# Patient Record
Sex: Male | Born: 1955
Health system: Southern US, Community
[De-identification: ages and names within clinical notes are randomized; demographics above are authoritative.]

## PROBLEM LIST (undated history)

## (undated) DIAGNOSIS — F1011 Alcohol abuse, in remission: Secondary | ICD-10-CM

## (undated) DIAGNOSIS — K5790 Diverticulosis of intestine, part unspecified, without perforation or abscess without bleeding: Secondary | ICD-10-CM

## (undated) DIAGNOSIS — F1010 Alcohol abuse, uncomplicated: Secondary | ICD-10-CM

## (undated) DIAGNOSIS — I679 Cerebrovascular disease, unspecified: Secondary | ICD-10-CM

## (undated) DIAGNOSIS — M542 Cervicalgia: Secondary | ICD-10-CM

## (undated) DIAGNOSIS — E119 Type 2 diabetes mellitus without complications: Secondary | ICD-10-CM

## (undated) DIAGNOSIS — E785 Hyperlipidemia, unspecified: Secondary | ICD-10-CM

## (undated) DIAGNOSIS — I10 Essential (primary) hypertension: Secondary | ICD-10-CM

## (undated) HISTORY — DX: Cervicalgia: M54.2

## (undated) HISTORY — DX: Hyperlipidemia, unspecified: E78.5

## (undated) HISTORY — DX: Essential (primary) hypertension: I10

## (undated) HISTORY — DX: Diverticulosis of intestine, part unspecified, without perforation or abscess without bleeding: K57.90

## (undated) HISTORY — DX: Cerebrovascular disease, unspecified: I67.9

## (undated) HISTORY — DX: Alcohol abuse, in remission: F10.11

## (undated) HISTORY — DX: Type 2 diabetes mellitus without complications: E11.9

## (undated) HISTORY — PX: VASECTOMY: SHX75

## (undated) HISTORY — DX: Alcohol abuse, uncomplicated: F10.10

## (undated) HISTORY — PX: FRACTURE SURGERY: SHX138

---
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
## 2006-03-30 ENCOUNTER — Encounter: Admission: RE | Admit: 2006-03-30 | Discharge: 2006-03-30 | Payer: Self-pay | Admitting: Orthopedic Surgery

## 2006-07-01 HISTORY — PX: SHOULDER SURGERY: SHX246

## 2008-11-16 ENCOUNTER — Encounter: Admission: RE | Admit: 2008-11-16 | Discharge: 2008-11-16 | Payer: Self-pay | Admitting: Internal Medicine

## 2012-03-31 ENCOUNTER — Ambulatory Visit: Payer: BC Managed Care – PPO | Admitting: Internal Medicine

## 2012-03-31 ENCOUNTER — Ambulatory Visit: Payer: BC Managed Care – PPO

## 2012-03-31 VITALS — BP 152/86 | HR 55 | Temp 97.7°F | Resp 16 | Ht 70.0 in | Wt 189.0 lb

## 2012-03-31 DIAGNOSIS — R2230 Localized swelling, mass and lump, unspecified upper limb: Secondary | ICD-10-CM

## 2012-03-31 DIAGNOSIS — R5381 Other malaise: Secondary | ICD-10-CM

## 2012-03-31 DIAGNOSIS — K3189 Other diseases of stomach and duodenum: Secondary | ICD-10-CM

## 2012-03-31 DIAGNOSIS — R5383 Other fatigue: Secondary | ICD-10-CM

## 2012-03-31 DIAGNOSIS — R1013 Epigastric pain: Secondary | ICD-10-CM

## 2012-03-31 DIAGNOSIS — M25519 Pain in unspecified shoulder: Secondary | ICD-10-CM

## 2012-03-31 LAB — COMPREHENSIVE METABOLIC PANEL
ALT: 14 U/L (ref 0–53)
AST: 17 U/L (ref 0–37)
BUN: 15 mg/dL (ref 6–23)
CO2: 26 mEq/L (ref 19–32)
Calcium: 10 mg/dL (ref 8.4–10.5)
Chloride: 105 mEq/L (ref 96–112)
Creat: 1.03 mg/dL (ref 0.50–1.35)
Total Bilirubin: 0.7 mg/dL (ref 0.3–1.2)

## 2012-03-31 LAB — POCT CBC
Hemoglobin: 14.8 g/dL (ref 14.1–18.1)
Lymph, poc: 1.7 (ref 0.6–3.4)
MCH, POC: 28.8 pg (ref 27–31.2)
MCHC: 31.8 g/dL (ref 31.8–35.4)
MID (cbc): 0.6 (ref 0–0.9)
MPV: 8.8 fL (ref 0–99.8)
POC Granulocyte: 6.8 (ref 2–6.9)
POC LYMPH PERCENT: 18.9 %L (ref 10–50)
POC MID %: 6.2 %M (ref 0–12)
RDW, POC: 14 %
WBC: 9.1 10*3/uL (ref 4.6–10.2)

## 2012-03-31 LAB — POCT SEDIMENTATION RATE: POCT SED RATE: 18 mm/hr (ref 0–22)

## 2012-03-31 NOTE — Progress Notes (Addendum)
  Subjective:    Patient ID: Brendan Wagner, male    DOB: 1955/07/24, 56 y.o.   MRN: [id number]  HPIpain around shoulder Since injury in over 3 months ago Pain prevents elevation and pushing off  he felt a pop as he lifted a heavy piece of sheet rockAnd has not been full strength ever since on the left///history of similar pop with tear on the right that required surgery  Also complaining of fatigue, easy fatigability with normal activity over the past 6-12 months Achesin lower arms if he mows the lawn. Neck stiff all the time. 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  Past medical history-last exam 2010 with mild elevation of lipids Review of Systems History diverticulitis/blames this for epigastric symptoms that worsen with aspirin or being empty over the past 2 years/eearly morning symptoms/worse with spicy foods No weight loss night sweats No fevers    Objective:   Physical Exam Filed Vitals:   03/31/12 1325  BP: 152/86(Wife is home blood pressures weekly for many years and he is usually normal)   Pulse: 55  Temp: 97.7 F (36.5 C)  Resp: 16  HEENT clear without thyromegaly or lymphadenopathy Heart regular Lungs clear Left shoulder with a painful arc and with pain on any movement with resistance Nontender of the shoulder proper, but very tender over the scapula and just above the scapula on the left with a firm nodule above the scapula, 1-2 cm//also tender in the anterior portion of the shoulder in the pectoralis without mass or defects/full chest excursion without pain       [facility] reading (PRIMARY) by  Dr. Deserai Cansler=Shoulder appears normal 5 there is a mass affect just above the scapula seen on 2 views that appears  to be soft tissue  Results for orders placed in  visit on 03/31/12  POCT CBC      Component Value Range   WBC 9.1  4.6 - 10.2 K/uL   Lymph, poc 1.7  0.6 - 3.4   POC LYMPH PERCENT 18.9  10 - 50 %L   MID (cbc) 0.6  0 - 0.9   POC MID % 6.2  0 - 12 %M   POC Granulocyte 6.8  2 - 6.9   Granulocyte percent 74.9  37 - 80 %G   RBC 5.13  4.69 - 6.13 M/uL   Hemoglobin 14.8  14.1 - 18.1 g/dL   HCT, POC 40.9  81.1 - 53.7 %   MCV 90.8  80 - 97 fL   MCH, POC 28.8  27 - 31.2 pg   MCHC 31.8  31.8 - 35.4 g/dL   RDW, POC 91.4     Platelet Count, POC 268  142 - 424 K/uL   MPV 8.8  0 - 99.8 fL    Assessment & Plan:   1. Fatigue  POCT CBC, Comprehensive metabolic panel, TSH, Testosterone, Testosterone, free  2. Pain in shoulder  POCT SEDIMENTATION RATE, DG Shoulder Left  3. Dyspepsia  H. pylori antibody, IgG   Notify lab results  ? MRI soft tissue left shoulder///Would need open MRI due to claustrophobia Heat and range of motion for shoulder CPE soon  Addend: Radiologist agreed about mass soft tissue/will order MRI Labs were normal

## 2012-04-01 LAB — TESTOSTERONE, FREE, TOTAL, SHBG
Sex Hormone Binding: 43 nmol/L (ref 13–71)
Testosterone, Free: 67.1 pg/mL (ref 47.0–244.0)
Testosterone: 391.56 ng/dL (ref 300–890)

## 2012-04-01 LAB — TSH: TSH: 2.999 u[IU]/mL (ref 0.350–4.500)

## 2012-04-01 NOTE — Addendum Note (Signed)
Addended by: Tonye Pearson on: 04/01/2012 06:02 PM   Modules accepted: Orders

## 2012-04-03 NOTE — Progress Notes (Signed)
CPE appt made for 11/6.

## 2012-04-07 ENCOUNTER — Telehealth: Payer: Self-pay

## 2012-04-07 MED ORDER — DIAZEPAM 5 MG PO TABS: ORAL_TABLET | ORAL | Status: DC

## 2012-04-07 NOTE — Telephone Encounter (Signed)
Done and printed

## 2012-04-07 NOTE — Telephone Encounter (Signed)
Brendan Wagner at [facility] request most recent xray report to be faxed to [phone]. Patient in office now.

## 2012-04-07 NOTE — Telephone Encounter (Signed)
SPOKE TO PATIENT. HE WILL PICK SCRIPT. HE WAS WONDERING IF WE WOULD R/S THE MRI OR COULD HE? HE WILL CALL BACK IN THE MORNING ABOUT THIS.

## 2012-04-07 NOTE — Telephone Encounter (Signed)
Patient had mri scheduled today but got claustrophobic and had to reschedule. He would like to know if he could have something called in for that.  [phone]

## 2012-04-07 NOTE — Telephone Encounter (Signed)
Sent requested X-rays to Sarah at [facility].

## 2012-05-06 ENCOUNTER — Ambulatory Visit (INDEPENDENT_AMBULATORY_CARE_PROVIDER_SITE_OTHER): Payer: BC Managed Care – PPO | Admitting: Internal Medicine

## 2012-05-06 ENCOUNTER — Encounter: Payer: Self-pay | Admitting: Internal Medicine

## 2012-05-06 ENCOUNTER — Ambulatory Visit: Payer: BC Managed Care – PPO

## 2012-05-06 VITALS — BP 148/84 | HR 76 | Temp 98.0°F | Resp 16 | Ht 69.5 in | Wt 189.4 lb

## 2012-05-06 DIAGNOSIS — R079 Chest pain, unspecified: Secondary | ICD-10-CM

## 2012-05-06 DIAGNOSIS — M25519 Pain in unspecified shoulder: Secondary | ICD-10-CM

## 2012-05-06 DIAGNOSIS — Z0000 Encounter for general adult medical examination without abnormal findings: Secondary | ICD-10-CM

## 2012-05-06 LAB — POCT URINALYSIS DIPSTICK
Ketones, UA: NEGATIVE
Protein, UA: NEGATIVE
Spec Grav, UA: <=1.005
Urobilinogen, UA: 0.2
pH, UA: 5.5

## 2012-05-06 NOTE — Progress Notes (Signed)
  Subjective:    Patient ID: Brendan Wagner, male    DOB: 07-Sep-1955, 56 y.o.   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 so he is unsure what he will do next  At last visit in October he reported pain around L shoulder Since injury in over 3 months ago  Pain prevents elevation and pushing off  he felt a pop as he lifted a heavy piece of sheet rockAnd has not been full strength ever since on the left///history of similar pop with tear on the right that required surgery  Exams revealed a soft tissue mass above the clavicle that was tender The radiologist recommended MRI which was scheduled but could not be completed because of claustrophobia/he is to reschedule down to do this after taking Valium which has been successful in the past  Family history: Many relatives had some form of cancer Married with daughter away from home with good job Nonsmoker/occasional alcohol Immunizations up today Has never had a colonoscopy Review of Systems 13 point review of systems positive for resolving fatigue, neck stiffness, occasional tinnitus. Chest tightness occasionally with activity but not definitely associated with shortness of breath and no diaphoresis or palpitations Many of his joints ache including his elbows and wrists from the work that he does Mother died 4 months ago    Objective:   Physical Exam Filed Vitals:   05/06/12 0952  BP: 148/84  Pulse: 76  Temp: 98 F (36.7 C)  Resp: 16   Weight 189 pounds Pupils equal round and reactive to light and accommodation/EOMs conjugate Nares clear/TMs clear/oral pharynx clear/no nodes or thyromegaly Lungs clear to auscultation Chest wall nontender There is tenderness above the left clavicle in the  midline though not as much swelling as before The left shoulder still has pain with abduction against resistance Heart regular without murmurs or clicks/no carotid bruit Abdomen with no masses or organomegaly No abdominal bruit Rectal with no masses/prostates soft and symmetrical without nodules Spine straight Extremities with no edema/full peripheral pulses Has tenderness at the lateral epicondyle bilaterally Neurological intact  [facility] reading (PRIMARY) by  Dr. Merla Riches. Chest x-ray is clear EKG is within normal limits      Assessment & Plan:  Annual physical examination Problem #1? Mass left clavicle Problem #2 mild increase in blood pressure without the diagnosis of hypertension Problem #3 mild overweight Problem #4 multiple overuse joint symptoms Problem #5 mild chest tightness probably musculoskeletal  Recommend MRI as scheduled Lose weight Recheck blood pressure outside Colonoscopy Notify routine labs

## 2012-05-07 ENCOUNTER — Encounter: Payer: Self-pay | Admitting: Internal Medicine

## 2012-06-22 ENCOUNTER — Encounter: Payer: Self-pay | Admitting: Internal Medicine

## 2014-07-28 IMAGING — CR DG SHOULDER 2+V*L*
3 series · 3 of 3 positions shown · non-contrast
Comparison: None.

CLINICAL DATA: Shoulder pain.

LEFT SHOULDER - 2+ VIEW

[ap int rot]
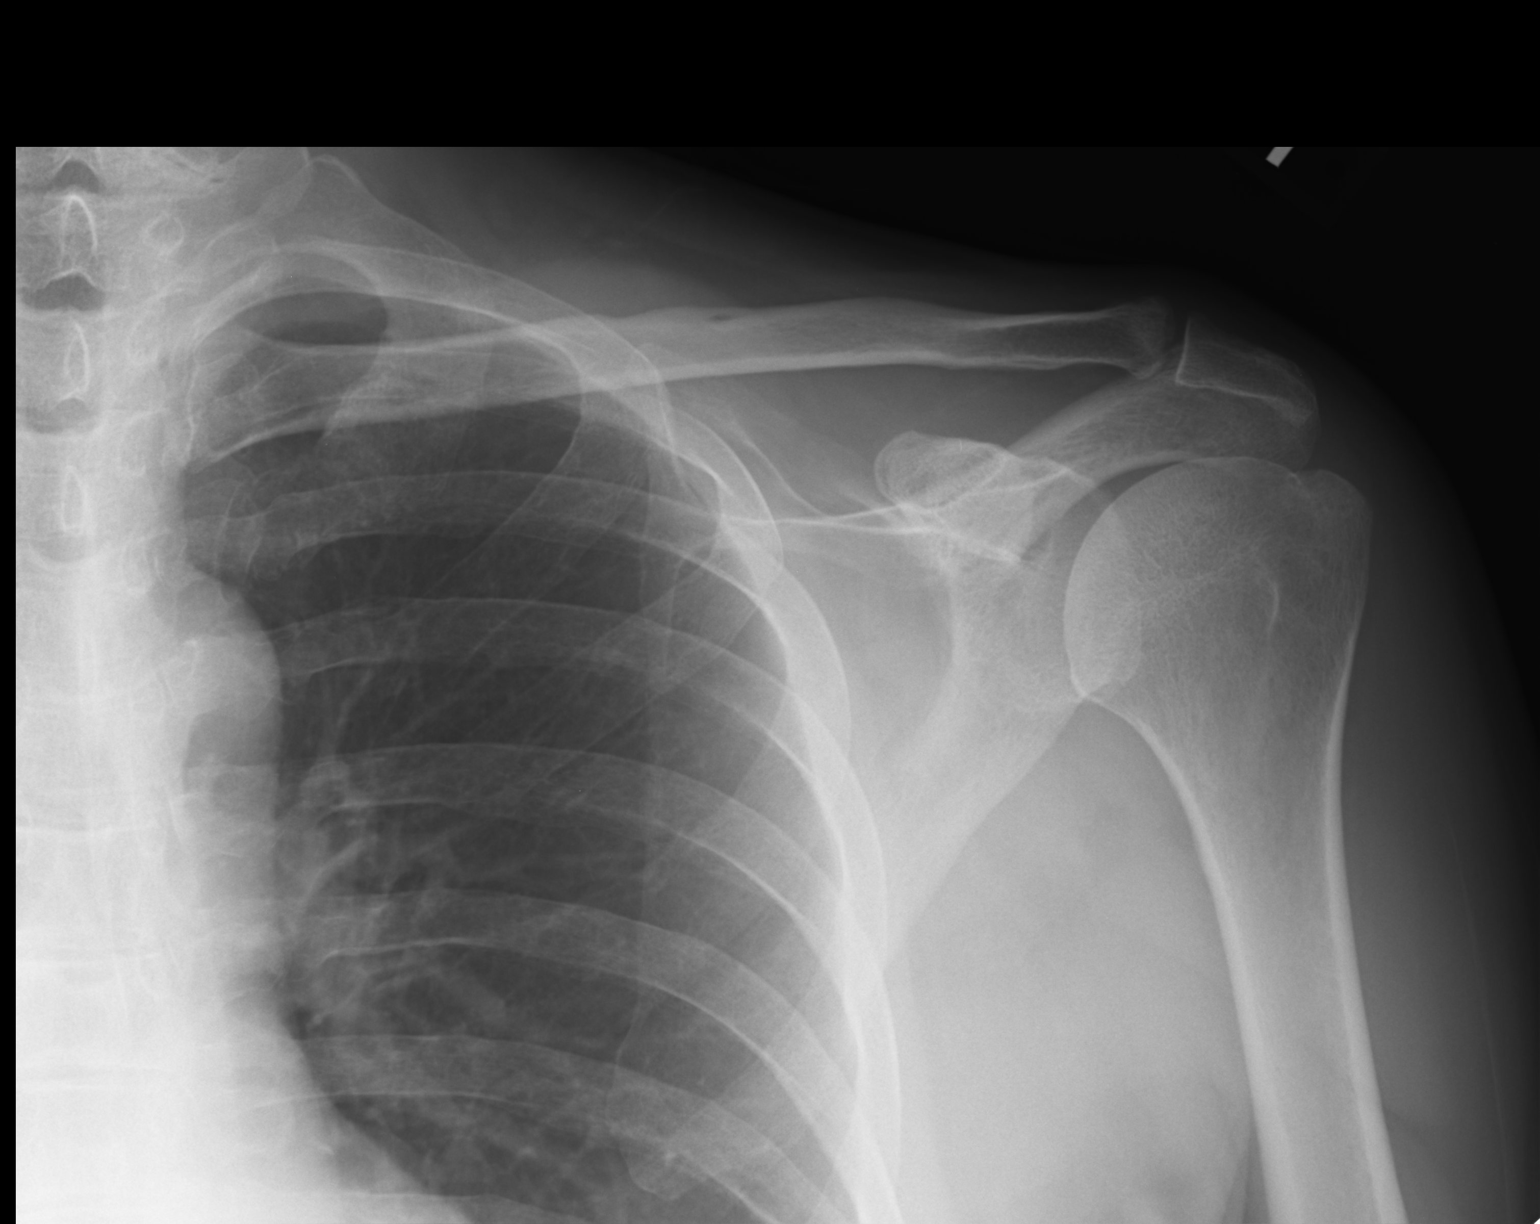

[ap ext rot]
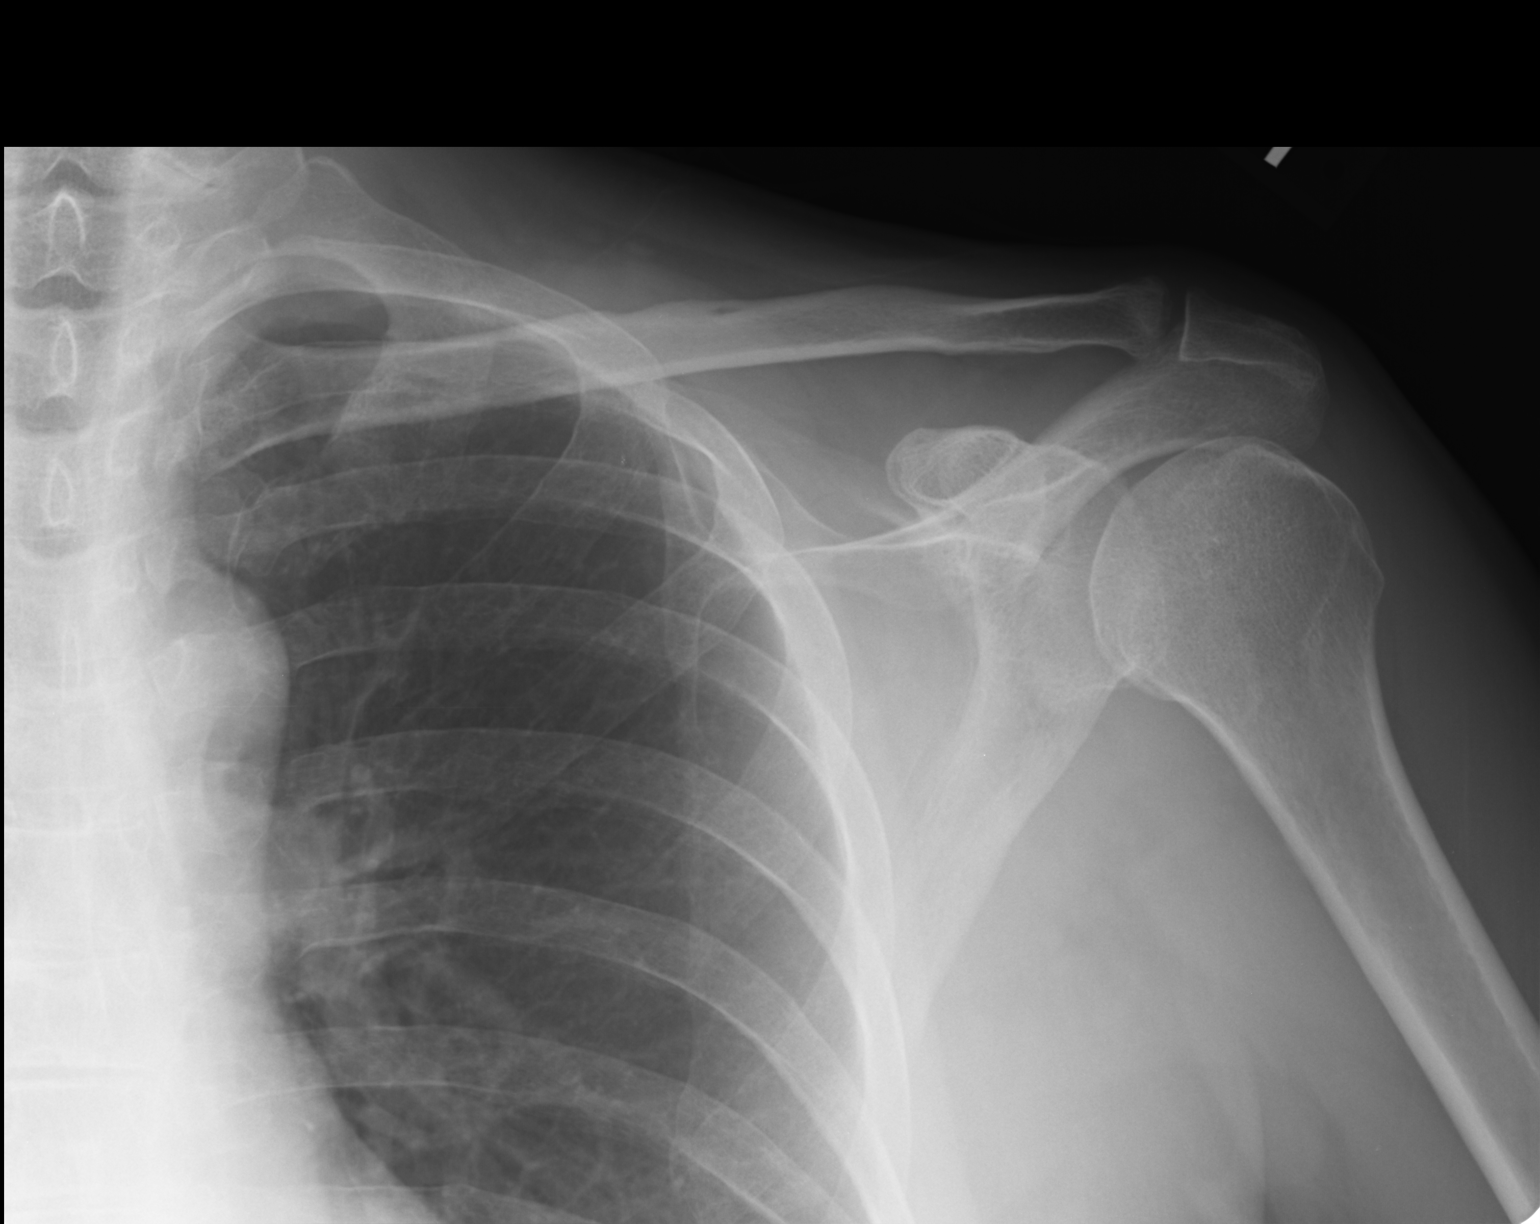

[AP]
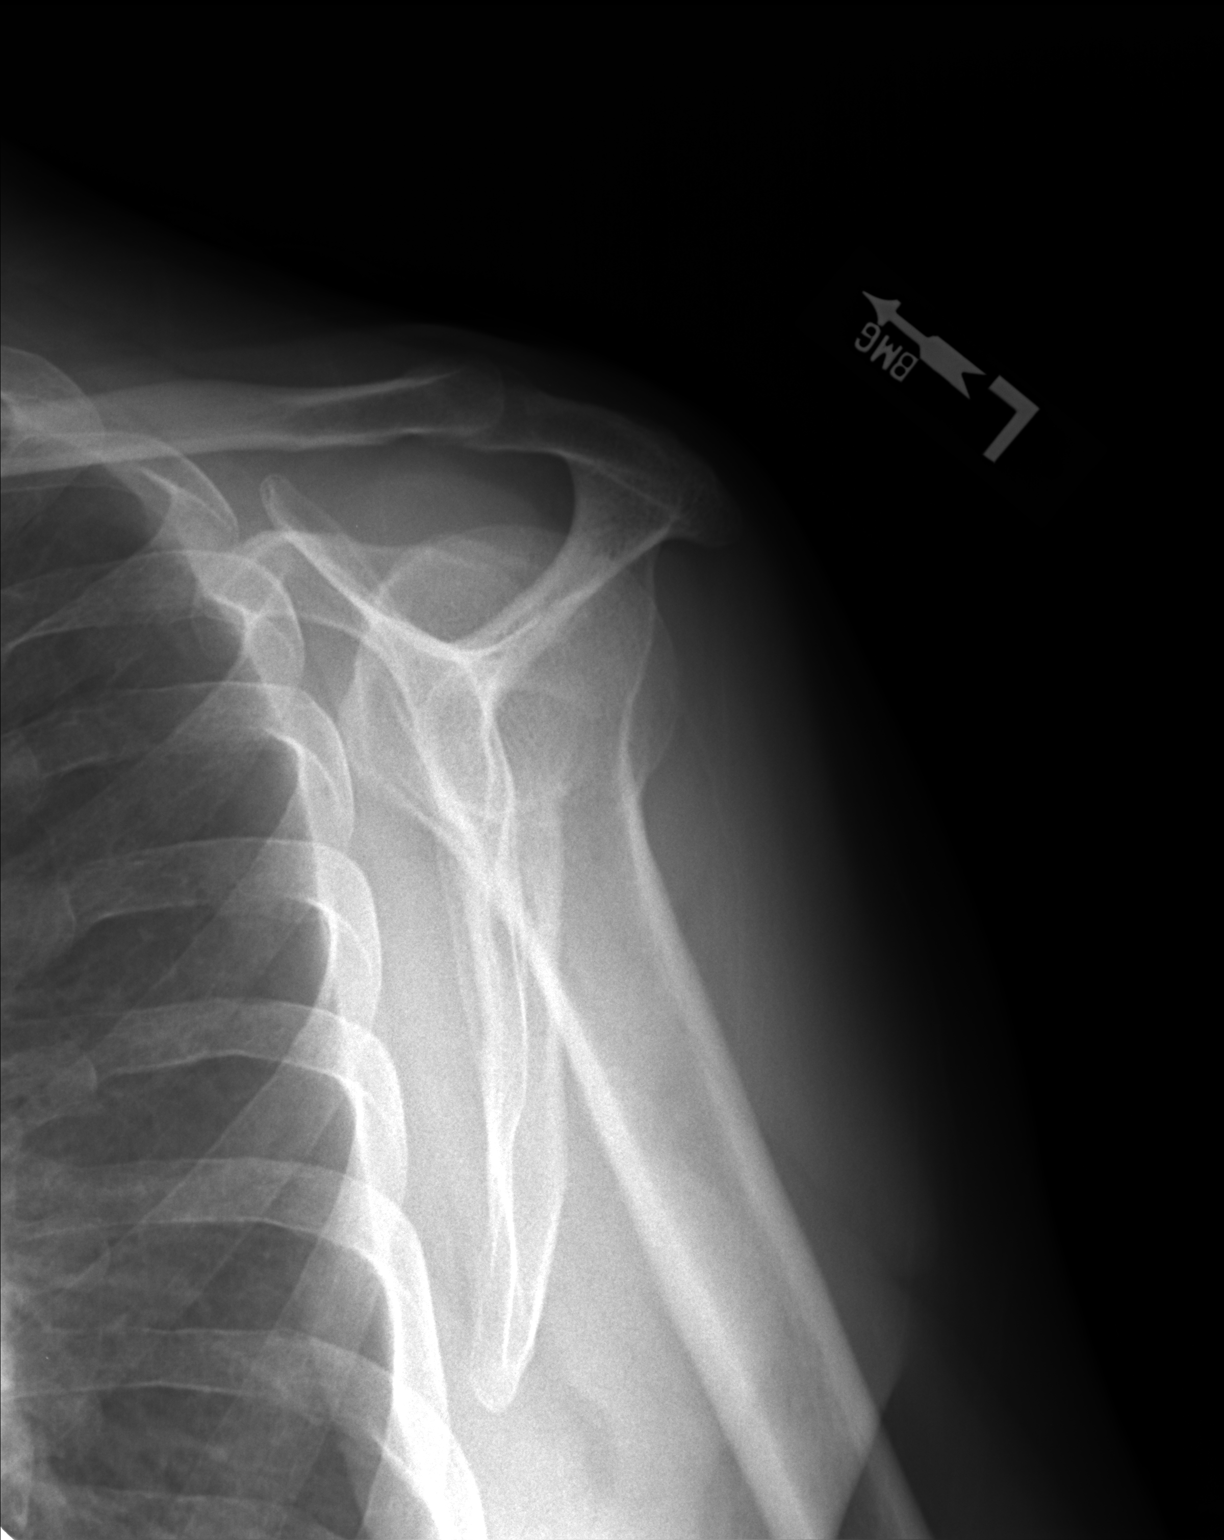

[3 of 3 positions shown; findings below may reference images not displayed]

FINDINGS: Minimal degenerative AC joint arthropathy. The acromial
undersurface is type 2 (curved).  No fracture or dislocation
observed.  Soft tissue density above the mid clavicle noted,
probably muscular, although the patient has a palpable mass in this
vicinity then MRI workup would be recommended.
IMPRESSION: 1.  Soft tissue density of the mid clavicle is probably muscular,
but if the patient has a palpable mass in this vicinity then MRI of
the left clavicle or MRI of the left shoulder with large field of
view to accommodate the mid clavicle would be recommended.
2.  Mild degenerative AC joint arthropathy.

## 2014-11-04 ENCOUNTER — Ambulatory Visit: Payer: 59

## 2015-02-15 ENCOUNTER — Ambulatory Visit (INDEPENDENT_AMBULATORY_CARE_PROVIDER_SITE_OTHER): Payer: 59 | Admitting: Internal Medicine

## 2015-02-15 ENCOUNTER — Encounter: Payer: Self-pay | Admitting: Internal Medicine

## 2015-02-15 VITALS — BP 140/78 | HR 64 | Temp 98.0°F | Resp 16 | Ht 69.0 in | Wt 189.8 lb

## 2015-02-15 DIAGNOSIS — Z23 Encounter for immunization: Secondary | ICD-10-CM | POA: Diagnosis not present

## 2015-02-15 DIAGNOSIS — Z0000 Encounter for general adult medical examination without abnormal findings: Secondary | ICD-10-CM | POA: Diagnosis not present

## 2015-02-15 DIAGNOSIS — R5383 Other fatigue: Secondary | ICD-10-CM

## 2015-02-15 LAB — CBC WITH DIFFERENTIAL/PLATELET
BASOS PCT: 1 % (ref 0–1)
Basophils Absolute: 0.1 10*3/uL (ref 0.0–0.1)
EOS ABS: 0.2 10*3/uL (ref 0.0–0.7)
EOS PCT: 3 % (ref 0–5)
HCT: 39.9 % (ref 39.0–52.0)
Hemoglobin: 14 g/dL (ref 13.0–17.0)
LYMPHS ABS: 1.2 10*3/uL (ref 0.7–4.0)
Lymphocytes Relative: 22 % (ref 12–46)
MCH: 29.5 pg (ref 26.0–34.0)
MCHC: 35.1 g/dL (ref 30.0–36.0)
MCV: 84.2 fL (ref 78.0–100.0)
MONO ABS: 0.5 10*3/uL (ref 0.1–1.0)
MONOS PCT: 9 % (ref 3–12)
MPV: 9.4 fL (ref 8.6–12.4)
NEUTROS PCT: 65 % (ref 43–77)
Neutro Abs: 3.4 10*3/uL (ref 1.7–7.7)
PLATELETS: 251 10*3/uL (ref 150–400)
RBC: 4.74 MIL/uL (ref 4.22–5.81)
RDW: 14.5 % (ref 11.5–15.5)
WBC: 5.3 10*3/uL (ref 4.0–10.5)

## 2015-02-15 LAB — COMPREHENSIVE METABOLIC PANEL
ALT: 20 U/L (ref 9–46)
AST: 19 U/L (ref 10–35)
Albumin: 4.3 g/dL (ref 3.6–5.1)
Alkaline Phosphatase: 55 U/L (ref 40–115)
BUN: 12 mg/dL (ref 7–25)
CHLORIDE: 104 mmol/L (ref 98–110)
CO2: 24 mmol/L (ref 20–31)
CREATININE: 0.81 mg/dL (ref 0.70–1.25)
Calcium: 9.5 mg/dL (ref 8.6–10.3)
Glucose, Bld: 96 mg/dL (ref 65–99)
POTASSIUM: 4.3 mmol/L (ref 3.5–5.3)
SODIUM: 139 mmol/L (ref 135–146)
Total Bilirubin: 0.5 mg/dL (ref 0.2–1.2)
Total Protein: 6.6 g/dL (ref 6.1–8.1)

## 2015-02-15 LAB — LIPID PANEL
CHOL/HDL RATIO: 4 ratio (ref <=5.0)
Cholesterol: 223 mg/dL — ABNORMAL HIGH (ref 125–200)
HDL: 56 mg/dL (ref >=40)
LDL Cholesterol: 144 mg/dL — ABNORMAL HIGH (ref <130)
Triglycerides: 115 mg/dL (ref <150)
VLDL: 23 mg/dL (ref <30)

## 2015-02-16 LAB — TSH: TSH: 3.562 u[IU]/mL (ref 0.350–4.500)

## 2015-02-16 LAB — HEPATITIS C ANTIBODY: HCV AB: NEGATIVE

## 2015-02-16 LAB — PSA: PSA: 0.48 ng/mL (ref <=4.00)

## 2015-02-16 LAB — HIV ANTIBODY (ROUTINE TESTING W REFLEX): HIV 1&2 Ab, 4th Generation: NONREACTIVE

## 2015-02-16 NOTE — Progress Notes (Signed)
   Subjective:    Patient ID: Brendan Wagner, male    DOB: 14-Dec-1955, 59 y.o.   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 Married w/ older kids doing well-daughter just moved to [employer] [place]      Review of Systems 14pt per form negative except jt compl R shpuolder s/p arthro w/ osteo and limited rom due to pain-gave up tennis alos lots of stiffness and discomf knees hips in early am or after sittin but activ not limited/no swell or red no radic sxt He also has c/o recent fatigue late in day after work--but no sleep dysruption(busy so only sleeps 6h) And no PND or snoring No wt changes or fevers 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 He appears well-developed and well-nourished.  HENT:  Head: Normocephalic and atraumatic.  Right Ear: Hearing, tympanic membrane, external ear and ear canal normal.  Left Ear: Hearing, tympanic membrane, external ear and ear canal normal.  Nose: Nose normal.  Mouth/Throat: Uvula is midline, oropharynx is clear and moist and mucous membranes are normal.  Eyes: Conjunctivae, EOM and lids are normal. Pupils are equal, round, and reactive to light. Right eye exhibits no discharge. Left eye exhibits no discharge. No scleral icterus.  Neck: Trachea normal and normal range of motion. Neck supple. Carotid bruit is not present.  Cardiovascular: Normal rate, regular rhythm, normal heart sounds, intact distal pulses and normal pulses.   No murmur heard. Pulmonary/Chest: Effort normal and breath sounds normal. No respiratory distress. He has no wheezes. He has no rhonchi. He has no rales.  Abdominal: Soft. Normal appearance and bowel sounds are normal. He exhibits no abdominal bruit. There is no tenderness.  Genitourinary: Rectum normal and prostate normal.  Musculoskeletal: Normal  range of motion. He exhibits no edema or tenderness.  R shoulder w/ gringd on overhead plus discomf but abd and add good in lower roms Other joints with good rom-hips better rom than most his age  Lymphadenopathy:       Head (right side): No submental, no submandibular, no tonsillar, no preauricular, no posterior auricular and no occipital adenopathy present.       Head (left side): No submental, no submandibular, no tonsillar, no preauricular, no posterior auricular and no occipital adenopathy present.    He has no cervical adenopathy.  Neurological: He is alert and oriented to person, place, and time. He has normal strength and normal reflexes. No cranial nerve deficit or sensory deficit. Coordination and gait normal.  Skin: Skin is warm, dry and intact. No lesion and no rash noted.  Psychiatric: He has a normal mood and affect. His speech is normal and behavior is normal. Judgment and thought content normal.   BP 140/78 mmHg  Pulse 64  Temp(Src) 98 F (36.7 C) (Oral)  Resp 16  Ht  (1.753 m)  Wt 189 lb 12.8 oz (86.093 kg)  BMI 28.02 kg/m2        Assessment & Plan:  Annual physical exam - Plan: CBC with Differential/Platelet, Comprehensive metabolic panel, PSA, Lipid panel, Hepatitis C antibody, HIV antibody  Other fatigue - Plan: TSH  Need for Tdap vaccination - Plan: Tdap vaccine greater than or equal to 7yo IM  Joint stiffness,shoulder OA--needs ongoing flex like yoga plus shoulder PT  Routine labs/contact

## 2015-02-19 ENCOUNTER — Encounter: Payer: Self-pay | Admitting: Internal Medicine

## 2015-05-29 ENCOUNTER — Encounter: Payer: Self-pay | Admitting: Internal Medicine

## 2016-08-08 ENCOUNTER — Encounter: Payer: Self-pay | Admitting: Urgent Care

## 2016-08-08 ENCOUNTER — Ambulatory Visit (INDEPENDENT_AMBULATORY_CARE_PROVIDER_SITE_OTHER): Payer: BLUE CROSS/BLUE SHIELD | Admitting: Urgent Care

## 2016-08-08 VITALS — BP 160/90 | HR 64 | Temp 97.9°F | Resp 16 | Ht 70.0 in | Wt 188.0 lb

## 2016-08-08 DIAGNOSIS — Z23 Encounter for immunization: Secondary | ICD-10-CM | POA: Diagnosis not present

## 2016-08-08 DIAGNOSIS — R030 Elevated blood-pressure reading, without diagnosis of hypertension: Secondary | ICD-10-CM

## 2016-08-08 DIAGNOSIS — Z0000 Encounter for general adult medical examination without abnormal findings: Secondary | ICD-10-CM | POA: Diagnosis not present

## 2016-08-08 DIAGNOSIS — I10 Essential (primary) hypertension: Secondary | ICD-10-CM

## 2016-08-08 DIAGNOSIS — M2550 Pain in unspecified joint: Secondary | ICD-10-CM

## 2016-08-08 DIAGNOSIS — M436 Torticollis: Secondary | ICD-10-CM

## 2016-08-08 MED ORDER — ZOSTER VACCINE LIVE 19400 UNT/0.65ML ~~LOC~~ SUSR: 0.6500 mL | Freq: Once | SUBCUTANEOUS | 0 refills | Status: AC

## 2016-08-08 MED ORDER — LISINOPRIL 10 MG PO TABS: 10.0000 mg | ORAL_TABLET | Freq: Every day | ORAL | 3 refills | Status: DC

## 2016-08-08 NOTE — Progress Notes (Signed)
MRN: [id number]  Subjective:   Mr. Brendan Wagner is a 61 y.o. male presenting for annual physical exam. 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 Denies smoking cigarettes. Has ~1 drink per day.   Medical care team includes: PCP: Marisel Tostenson, PA-C Vision: Wears eye glasses, sees eye doctor every other year. Plans on setting up a f/u appointment because of blurred vision. Dental: Gets cleanings every 6 months. Health Maintenance: Last colonoscopy was 2015, has f/u in 7-8 years.   Neck pain - Reports 1 year history of intermittent neck pain. Pain is achy, worsened by his work, relieved by taking bath in hot tub. 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 Has tried [employer] [place], heating pad. Also has joint aches in elbows, arms and knees. 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   Ammon has a current medication list which includes the following prescription(s): aspirin ec. He has No Known Allergies.  [place] denies past medical history. Also  has a past surgical history that includes Appendectomy (1973); Shoulder surgery (2008); Fracture surgery (Left); and Vasectomy. His family history includes Alcohol abuse in his brother; Alzheimer's disease in his father; Cancer in his father, maternal grandfather, and mother; Hyperlipidemia in his paternal grandfather; Hypertension in his father; Kidney disease in his mother.  Immunizations: Tdap 02/15/2015, Flu 06/27/2016  Review of Systems  Constitutional: Negative for chills, diaphoresis, fever, malaise/fatigue and weight loss.  HENT: Negative for congestion, ear discharge, ear pain, hearing loss, nosebleeds, sore throat and tinnitus.   Eyes: Negative for blurred vision, double vision, photophobia, pain, discharge and redness.  Respiratory: Negative for cough, shortness of breath and wheezing.   Cardiovascular: Negative for chest pain,  palpitations and leg swelling.  Gastrointestinal: Negative for abdominal pain, blood in stool, constipation, diarrhea, nausea and vomiting.  Genitourinary: Negative for dysuria, flank pain, frequency, hematuria and urgency.  Musculoskeletal: Positive for joint pain and neck pain. Negative for back pain and myalgias.  Skin: Negative for itching and rash.  Neurological: Negative for dizziness, tingling, seizures, loss of consciousness, weakness and headaches.  Endo/Heme/Allergies: Negative for polydipsia.  Psychiatric/Behavioral: Negative for depression, hallucinations, memory loss, substance abuse and suicidal ideas. The patient is not nervous/anxious and does not have insomnia.    Objective:   Vitals: BP (!) 160/90 (BP Location: Right Arm, Patient Position: Sitting, Cuff Size: Normal)   Pulse 64   Temp 97.9 F (36.6 C) (Oral)   Resp 16   Ht 5\' 10"  (1.778 m)   Wt 188 lb (85.3 kg)   SpO2 97%   BMI 26.98 kg/m   BP Readings from Last 3 Encounters:  08/08/16 (!) 160/90  02/15/15 140/78  05/06/12 (!) 148/84    Physical Exam  Constitutional: He is oriented to person, place, and time. He appears well-developed and well-nourished.  HENT:  TM's intact bilaterally, no effusions or erythema. Nasal turbinates pink and moist, nasal passages patent. No sinus tenderness. Oropharynx clear, mucous membranes moist, dentition in good repair.  Eyes: Conjunctivae and EOM are normal. Pupils are equal, round, and reactive to light. Right eye exhibits no discharge. Left eye exhibits no discharge. No scleral icterus.  Neck: Normal range of motion. Neck supple. No thyromegaly present.  Cardiovascular: Normal rate, regular rhythm and intact distal pulses.  Exam reveals no gallop and no friction rub.   No murmur heard. Pulmonary/Chest: No stridor.  No respiratory distress. He has no wheezes. He has no rales.  Abdominal: Soft. Bowel sounds are normal. He exhibits no distension and no mass. There is no  tenderness.  Musculoskeletal: Normal range of motion. He exhibits no edema or tenderness.  Lymphadenopathy:    He has no cervical adenopathy.  Neurological: He is alert and oriented to person, place, and time. He has normal reflexes.  Skin: Skin is warm and dry. No rash noted. No erythema. No pallor.  Psychiatric: He has a normal mood and affect.   Assessment and Plan :   1. Annual physical exam - Labs pending, patient is medically stable. Discussed healthy lifestyle, diet, exercise, preventative care, vaccinations, and addressed patient's concerns.    2. Need for shingles vaccine - Zoster Vaccine Live, PF, (ZOSTAVAX) 04540 UNT/0.65ML injection; Inject 19,400 Units into the skin once.  Dispense: 1 each; Refill: 0  3. Elevated blood pressure reading 4. Essential hypertension - Start lisinopril. Continue healthy diet. Recheck in 4 weeks.  5. Neck stiffness 6. Arthralgia, unspecified joint - Offered x-rays but patient declined. I also offered muscle relaxant but patient would like to hold off. He will use APAP as needed. Recheck prn.  Wallis Bamberg, PA-C [facility] at [facility] [phone] 08/08/2016  9:07 AM

## 2016-08-08 NOTE — Patient Instructions (Addendum)
Keeping you healthy  Get these tests  Blood pressure- Have your blood pressure checked once a year by your healthcare provider.  Normal blood pressure is 120/80  Weight- Have your body mass index (BMI) calculated to screen for obesity.  BMI is a measure of body fat based on height and weight. You can also calculate your own BMI at [email or web address].  Cholesterol- Have your cholesterol checked every year.  Diabetes- Have your blood sugar checked regularly if you have high blood pressure, high cholesterol, have a family history of diabetes or if you are overweight.  Screening for Colon Cancer- Colonoscopy starting at age 53.  Screening may begin sooner depending on your family history and other health conditions. Follow up colonoscopy as directed by your Gastroenterologist.  Screening for Prostate Cancer- Both blood work (PSA) and a rectal exam help screen for Prostate Cancer.  Screening begins at age 1 with African-American men and at age 25 with Caucasian men.  Screening may begin sooner depending on your family history.  Take these medicines  Aspirin- One aspirin daily can help prevent Heart disease and Stroke.  Flu shot- Every fall.  Tetanus- Every 10 years.  Zostavax- Once after the age of 80 to prevent Shingles.  Pneumonia shot- Once after the age of 46; if you are younger than 70, ask your healthcare provider if you need a Pneumonia shot.  Take these steps  Don't smoke- If you do smoke, talk to your doctor about quitting.  For tips on how to quit, go to www.smokefree.gov or call 1-800-QUIT-NOW.  Be physically active- Exercise 5 days a week for at least 30 minutes.  If you are not already physically active start slow and gradually work up to 30 minutes of moderate physical activity.  Examples of moderate activity include walking briskly, mowing the yard, dancing, swimming, bicycling, etc.  Eat a healthy diet- Eat a variety of healthy food such as fruits, vegetables, low  fat milk, low fat cheese, yogurt, lean meant, poultry, fish, beans, tofu, etc. For more information go to www.thenutritionsource.org  Drink alcohol in moderation- Limit alcohol intake to less than two drinks a day. Never drink and drive.  Dentist- Brush and floss twice daily; visit your dentist twice a year.  Depression- Your emotional health is as important as your physical health. If you're feeling down, or losing interest in things you would normally enjoy please talk to your healthcare provider.  Eye exam- Visit your eye doctor every year.  Safe sex- If you may be exposed to a sexually transmitted infection, use a condom.  Seat belts- Seat belts can save your life; always wear one.  Smoke/Carbon Monoxide detectors- These detectors need to be installed on the appropriate level of your home.  Replace batteries at least once a year.  Skin cancer- When out in the sun, cover up and use sunscreen 15 SPF or higher.  Violence- If anyone is threatening you, please tell your healthcare provider.  Living Will/ Health care power of attorney- Speak with your healthcare provider and family.    Hypertension Hypertension, commonly called high blood pressure, is when the force of blood pumping through your arteries is too strong. Your arteries are the blood vessels that carry blood from your heart throughout your body. A blood pressure reading consists of a higher number over a lower number, such as 110/72. The higher number (systolic) is the pressure inside your arteries when your heart pumps. The lower number (diastolic) is the pressure inside your arteries when  your heart relaxes. Ideally you want your blood pressure below 120/80. Hypertension forces your heart to work harder to pump blood. Your arteries may become narrow or stiff. Having untreated or uncontrolled hypertension can cause heart attack, stroke, kidney disease, and other problems. What increases the risk? Some risk factors for high  blood pressure are controllable. Others are not. Risk factors you cannot control include:  Race. You may be at higher risk if you are African American.  Age. Risk increases with age.  Gender. Men are at higher risk than women before age 63 years. After age 63, women are at higher risk than men. Risk factors you can control include:  Not getting enough exercise or physical activity.  Being overweight.  Getting too much fat, sugar, calories, or salt in your diet.  Drinking too much alcohol. What are the signs or symptoms? Hypertension does not usually cause signs or symptoms. Extremely high blood pressure (hypertensive crisis) may cause headache, anxiety, shortness of breath, and nosebleed. How is this diagnosed? To check if you have hypertension, your health care provider will measure your blood pressure while you are seated, with your arm held at the level of your heart. It should be measured at least twice using the same arm. Certain conditions can cause a difference in blood pressure between your right and left arms. A blood pressure reading that is higher than normal on one occasion does not mean that you need treatment. If it is not clear whether you have high blood pressure, you may be asked to return on a different day to have your blood pressure checked again. Or, you may be asked to monitor your blood pressure at home for 1 or more weeks. How is this treated? Treating high blood pressure includes making lifestyle changes and possibly taking medicine. Living a healthy lifestyle can help lower high blood pressure. You may need to change some of your habits. Lifestyle changes may include:  Following the DASH diet. This diet is high in fruits, vegetables, and whole grains. It is low in salt, red meat, and added sugars.  Keep your sodium intake below 2,300 mg per day.  Getting at least 30-45 minutes of aerobic exercise at least 4 times per week.  Losing weight if necessary.  Not  smoking.  Limiting alcoholic beverages.  Learning ways to reduce stress. Your health care provider may prescribe medicine if lifestyle changes are not enough to get your blood pressure under control, and if one of the following is true:  You are 1918-63 years of age and your systolic blood pressure is above 140.  You are 63 years of age or older, and your systolic blood pressure is above 150.  Your diastolic blood pressure is above 90.  You have diabetes, and your systolic blood pressure is over 140 or your diastolic blood pressure is over 90.  You have kidney disease and your blood pressure is above 140/90.  You have heart disease and your blood pressure is above 140/90. Your personal target blood pressure may vary depending on your medical conditions, your age, and other factors. Follow these instructions at home:  Have your blood pressure rechecked as directed by your health care provider.  Take medicines only as directed by your health care provider. Follow the directions carefully. Blood pressure medicines must be taken as prescribed. The medicine does not work as well when you skip doses. Skipping doses also puts you at risk for problems.  Do not smoke.  Monitor your blood  pressure at home as directed by your health care provider. Contact a health care provider if:  You think you are having a reaction to medicines taken.  You have recurrent headaches or feel dizzy.  You have swelling in your ankles.  You have trouble with your vision. Get help right away if:  You develop a severe headache or confusion.  You have unusual weakness, numbness, or feel faint.  You have severe chest or abdominal pain.  You vomit repeatedly.  You have trouble breathing. This information is not intended to replace advice given to you by your health care provider. Make sure you discuss any questions you have with your health care provider. Document Released: 06/17/2005 Document Revised:  11/23/2015 Document Reviewed: 04/09/2013 Elsevier Interactive Patient Education  2017 Elsevier Inc.    Lisinopril tablets What is this medicine? LISINOPRIL (lyse IN oh pril) is an ACE inhibitor. This medicine is used to treat high blood pressure and heart failure. It is also used to protect the heart immediately after a heart attack. This medicine may be used for other purposes; ask your health care provider or pharmacist if you have questions. COMMON BRAND NAME(S): Prinivil, Zestril What should I tell my health care provider before I take this medicine? They need to know if you have any of these conditions: -diabetes -heart or blood vessel disease -kidney disease -low blood pressure -previous swelling of the tongue, face, or lips with difficulty breathing, difficulty swallowing, hoarseness, or tightening of the throat -an unusual or allergic reaction to lisinopril, other ACE inhibitors, insect venom, foods, dyes, or preservatives -pregnant or trying to get pregnant -breast-feeding How should I use this medicine? Take this medicine by mouth with a glass of water. Follow the directions on your prescription label. You may take this medicine with or without food. If it upsets your stomach, take it with food. Take your medicine at regular intervals. Do not take it more often than directed. Do not stop taking except on your doctor's advice. Talk to your pediatrician regarding the use of this medicine in children. Special care may be needed. While this drug may be prescribed for children as young as 80 years of age for selected conditions, precautions do apply. Overdosage: If you think you have taken too much of this medicine contact a poison control center or emergency room at once. NOTE: This medicine is only for you. Do not share this medicine with others. What if I miss a dose? If you miss a dose, take it as soon as you can. If it is almost time for your next dose, take only that dose. Do not  take double or extra doses. What may interact with this medicine? Do not take this medicine with any of the following medications: -hymenoptera venom -sacubitril; valsartan This medicines may also interact with the following medications: -aliskiren -angiotensin receptor blockers, like losartan or valsartan -certain medicines for diabetes -diuretics -everolimus -gold compounds -lithium -NSAIDs, medicines for pain and inflammation, like ibuprofen or naproxen -potassium salts or supplements -salt substitutes -sirolimus -temsirolimus This list may not describe all possible interactions. Give your health care provider a list of all the medicines, herbs, non-prescription drugs, or dietary supplements you use. Also tell them if you smoke, drink alcohol, or use illegal drugs. Some items may interact with your medicine. What should I watch for while using this medicine? Visit your doctor or health care professional for regular check ups. Check your blood pressure as directed. Ask your doctor what your blood pressure  should be, and when you should contact him or her. Do not treat yourself for coughs, colds, or pain while you are using this medicine without asking your doctor or health care professional for advice. Some ingredients may increase your blood pressure. Women should inform their doctor if they wish to become pregnant or think they might be pregnant. There is a potential for serious side effects to an unborn child. Talk to your health care professional or pharmacist for more information. Check with your doctor or health care professional if you get an attack of severe diarrhea, nausea and vomiting, or if you sweat a lot. The loss of too much body fluid can make it dangerous for you to take this medicine. You may get drowsy or dizzy. Do not drive, use machinery, or do anything that needs mental alertness until you know how this drug affects you. Do not stand or sit up quickly, especially if  you are an older patient. This reduces the risk of dizzy or fainting spells. Alcohol can make you more drowsy and dizzy. Avoid alcoholic drinks. Avoid salt substitutes unless you are told otherwise by your doctor or health care professional. What side effects may I notice from receiving this medicine? Side effects that you should report to your doctor or health care professional as soon as possible: -allergic reactions like skin rash, itching or hives, swelling of the hands, feet, face, lips, throat, or tongue -breathing problems -signs and symptoms of kidney injury like trouble passing urine or change in the amount of urine -signs and symptoms of increased potassium like muscle weakness; chest pain; or fast, irregular heartbeat -signs and symptoms of liver injury like dark yellow or brown urine; general ill feeling or flu-like symptoms; light-colored stools; loss of appetite; nausea; right upper belly pain; unusually weak or tired; yellowing of the eyes or skin -signs and symptoms of low blood pressure like dizziness; feeling faint or lightheaded, falls; unusually weak or tired -stomach pain with or without nausea and vomiting Side effects that usually do not require medical attention (report to your doctor or health care professional if they continue or are bothersome): -changes in taste -cough -dizziness -fever -headache -sensitivity to light This list may not describe all possible side effects. Call your doctor for medical advice about side effects. You may report side effects to FDA at 1-800-FDA-1088. Where should I keep my medicine? Keep out of the reach of children. Store at room temperature between 15 and 30 degrees C (59 and 86 degrees F). Protect from moisture. Keep container tightly closed. Throw away any unused medicine after the expiration date. NOTE: This sheet is a summary. It may not cover all possible information. If you have questions about this medicine, talk to your doctor,  pharmacist, or health care provider.  2017 Elsevier/Gold Standard (2015-08-07 12:52:35)     You may take 500mg  Tylenol every 6 hours for pain and inflammation. This is also recommended for arthritis.  Cyclobenzaprine tablets What is this medicine? CYCLOBENZAPRINE (sye kloe BEN za preen) is a muscle relaxer. It is used to treat muscle pain, spasms, and stiffness. This medicine may be used for other purposes; ask your health care provider or pharmacist if you have questions. COMMON BRAND NAME(S): Fexmid, Flexeril What should I tell my health care provider before I take this medicine? They need to know if you have any of these conditions: -heart disease, irregular heartbeat, or previous heart attack -liver disease -thyroid problem -an unusual or allergic reaction to cyclobenzaprine, tricyclic  antidepressants, lactose, other medicines, foods, dyes, or preservatives -pregnant or trying to get pregnant -breast-feeding How should I use this medicine? Take this medicine by mouth with a glass of water. Follow the directions on the prescription label. If this medicine upsets your stomach, take it with food or milk. Take your medicine at regular intervals. Do not take it more often than directed. Talk to your pediatrician regarding the use of this medicine in children. Special care may be needed. Overdosage: If you think you have taken too much of this medicine contact a poison control center or emergency room at once. NOTE: This medicine is only for you. Do not share this medicine with others. What if I miss a dose? If you miss a dose, take it as soon as you can. If it is almost time for your next dose, take only that dose. Do not take double or extra doses. What may interact with this medicine? Do not take this medicine with any of the following medications: -certain medicines for fungal infections like fluconazole, itraconazole, ketoconazole, posaconazole,  voriconazole -cisapride -dofetilide -dronedarone -halofantrine -levomethadyl -MAOIs like Carbex, Eldepryl, Marplan, Nardil, and Parnate -narcotic medicines for cough -pimozide -thioridazine -ziprasidone This medicine may also interact with the following medications: -alcohol -antihistamines for allergy, cough and cold -certain medicines for anxiety or sleep -certain medicines for cancer -certain medicines for depression like amitriptyline, fluoxetine, sertraline -certain medicines for infection like alfuzosin, chloroquine, clarithromycin, levofloxacin, mefloquine, pentamidine, troleandomycin -certain medicines for irregular heart beat -certain medicines for seizures like phenobarbital, primidone -contrast dyes -general anesthetics like halothane, isoflurane, methoxyflurane, propofol -local anesthetics like lidocaine, pramoxine, tetracaine -medicines that relax muscles for surgery -narcotic medicines for pain -other medicines that prolong the QT interval (cause an abnormal heart rhythm) -phenothiazines like chlorpromazine, mesoridazine, prochlorperazine This list may not describe all possible interactions. Give your health care provider a list of all the medicines, herbs, non-prescription drugs, or dietary supplements you use. Also tell them if you smoke, drink alcohol, or use illegal drugs. Some items may interact with your medicine. What should I watch for while using this medicine? Tell your doctor or health care professional if your symptoms do not start to get better or if they get worse. You may get drowsy or dizzy. Do not drive, use machinery, or do anything that needs mental alertness until you know how this medicine affects you. Do not stand or sit up quickly, especially if you are an older patient. This reduces the risk of dizzy or fainting spells. Alcohol may interfere with the effect of this medicine. Avoid alcoholic drinks. If you are taking another medicine that also  causes drowsiness, you may have more side effects. Give your health care provider a list of all medicines you use. Your doctor will tell you how much medicine to take. Do not take more medicine than directed. Call emergency for help if you have problems breathing or unusual sleepiness. Your mouth may get dry. Chewing sugarless gum or sucking hard candy, and drinking plenty of water may help. Contact your doctor if the problem does not go away or is severe. What side effects may I notice from receiving this medicine? Side effects that you should report to your doctor or health care professional as soon as possible: -allergic reactions like skin rash, itching or hives, swelling of the face, lips, or tongue -breathing problems -chest pain -fast, irregular heartbeat -hallucinations -seizures -unusually weak or tired Side effects that usually do not require medical attention (report to your doctor  or health care professional if they continue or are bothersome): -headache -nausea, vomiting This list may not describe all possible side effects. Call your doctor for medical advice about side effects. You may report side effects to FDA at 1-800-FDA-1088. Where should I keep my medicine? Keep out of the reach of children. Store at room temperature between 15 and 30 degrees C (59 and 86 degrees F). Keep container tightly closed. Throw away any unused medicine after the expiration date. NOTE: This sheet is a summary. It may not cover all possible information. If you have questions about this medicine, talk to your doctor, pharmacist, or health care provider.  2017 Elsevier/Gold Standard (2015-03-28 12:05:46)    IF you received an x-ray today, you will receive an invoice from [facility]. Please contact [facility] at [phone] with questions or concerns regarding your invoice.   IF you received labwork today, you will receive an invoice from [facility]. Please contact [facility] at  [phone] with questions or concerns regarding your invoice.   Our billing staff will not be able to assist you with questions regarding bills from these companies.  You will be contacted with the lab results as soon as they are available. The fastest way to get your results is to activate your My Chart account. Instructions are located on the last page of this paperwork. If you have not heard from [place] regarding the results in 2 weeks, please contact this office.

## 2016-08-09 LAB — CMP14+EGFR
ALT: 19 IU/L (ref 0–44)
AST: 21 IU/L (ref 0–40)
Albumin/Globulin Ratio: 2.1 (ref 1.2–2.2)
Albumin: 4.7 g/dL (ref 3.6–4.8)
Alkaline Phosphatase: 68 IU/L (ref 39–117)
BUN/Creatinine Ratio: 17 (ref 10–24)
BUN: 15 mg/dL (ref 8–27)
Bilirubin Total: 0.5 mg/dL (ref 0.0–1.2)
CALCIUM: 9.6 mg/dL (ref 8.6–10.2)
CO2: 22 mmol/L (ref 18–29)
CREATININE: 0.88 mg/dL (ref 0.76–1.27)
Chloride: 102 mmol/L (ref 96–106)
GFR calc Af Amer: 106 mL/min/{1.73_m2} (ref >59)
GFR, EST NON AFRICAN AMERICAN: 92 mL/min/{1.73_m2} (ref >59)
GLOBULIN, TOTAL: 2.2 g/dL (ref 1.5–4.5)
Glucose: 91 mg/dL (ref 65–99)
Potassium: 4.4 mmol/L (ref 3.5–5.2)
Sodium: 142 mmol/L (ref 134–144)
TOTAL PROTEIN: 6.9 g/dL (ref 6.0–8.5)

## 2016-08-09 LAB — PSA: Prostate Specific Ag, Serum: 0.4 ng/mL (ref 0.0–4.0)

## 2016-08-09 LAB — LIPID PANEL
CHOL/HDL RATIO: 4.3 ratio (ref 0.0–5.0)
CHOLESTEROL TOTAL: 243 mg/dL — AB (ref 100–199)
HDL: 57 mg/dL (ref >39)
LDL CALC: 161 mg/dL — AB (ref 0–99)
Triglycerides: 125 mg/dL (ref 0–149)
VLDL Cholesterol Cal: 25 mg/dL (ref 5–40)

## 2016-08-09 LAB — TSH: TSH: 3.75 u[IU]/mL (ref 0.450–4.500)

## 2016-09-05 ENCOUNTER — Encounter: Payer: Self-pay | Admitting: Urgent Care

## 2016-09-05 ENCOUNTER — Ambulatory Visit (INDEPENDENT_AMBULATORY_CARE_PROVIDER_SITE_OTHER): Payer: BLUE CROSS/BLUE SHIELD | Admitting: Urgent Care

## 2016-09-05 VITALS — BP 142/78 | HR 59 | Temp 97.8°F | Resp 16 | Ht 70.0 in | Wt 188.0 lb

## 2016-09-05 DIAGNOSIS — M542 Cervicalgia: Secondary | ICD-10-CM | POA: Diagnosis not present

## 2016-09-05 DIAGNOSIS — I10 Essential (primary) hypertension: Secondary | ICD-10-CM

## 2016-09-05 DIAGNOSIS — M436 Torticollis: Secondary | ICD-10-CM | POA: Diagnosis not present

## 2016-09-05 DIAGNOSIS — Z8249 Family history of ischemic heart disease and other diseases of the circulatory system: Secondary | ICD-10-CM | POA: Diagnosis not present

## 2016-09-05 MED ORDER — AMLODIPINE BESYLATE 5 MG PO TABS: 5.0000 mg | ORAL_TABLET | Freq: Every day | ORAL | 3 refills | Status: DC

## 2016-09-05 NOTE — Patient Instructions (Addendum)
Hypertension Hypertension, commonly called high blood pressure, is when the force of blood pumping through the arteries is too strong. The arteries are the blood vessels that carry blood from the heart throughout the body. Hypertension forces the heart to work harder to pump blood and may cause arteries to become narrow or stiff. Having untreated or uncontrolled hypertension can cause heart attacks, strokes, kidney disease, and other problems. A blood pressure reading consists of a higher number over a lower number. Ideally, your blood pressure should be below 120/80. The first ("top") number is called the systolic pressure. It is a measure of the pressure in your arteries as your heart beats. The second ("bottom") number is called the diastolic pressure. It is a measure of the pressure in your arteries as the heart relaxes. What are the causes? The cause of this condition is not known. What increases the risk? Some risk factors for high blood pressure are under your control. Others are not. Factors you can change   Smoking.  Having type 2 diabetes mellitus, high cholesterol, or both.  Not getting enough exercise or physical activity.  Being overweight.  Having too much fat, sugar, calories, or salt (sodium) in your diet.  Drinking too much alcohol. Factors that are difficult or impossible to change   Having chronic kidney disease.  Having a family history of high blood pressure.  Age. Risk increases with age.  Race. You may be at higher risk if you are African-American.  Gender. Men are at higher risk than women before age 45. After age 65, women are at higher risk than men.  Having obstructive sleep apnea.  Stress. What are the signs or symptoms? Extremely high blood pressure (hypertensive crisis) may cause:  Headache.  Anxiety.  Shortness of breath.  Nosebleed.  Nausea and vomiting.  Severe chest pain.  Jerky movements you cannot control (seizures). How is this  diagnosed? This condition is diagnosed by measuring your blood pressure while you are seated, with your arm resting on a surface. The cuff of the blood pressure monitor will be placed directly against the skin of your upper arm at the level of your heart. It should be measured at least twice using the same arm. Certain conditions can cause a difference in blood pressure between your right and left arms. Certain factors can cause blood pressure readings to be lower or higher than normal (elevated) for a short period of time:  When your blood pressure is higher when you are in a health care provider's office than when you are at home, this is called white coat hypertension. Most people with this condition do not need medicines.  When your blood pressure is higher at home than when you are in a health care provider's office, this is called masked hypertension. Most people with this condition may need medicines to control blood pressure. If you have a high blood pressure reading during one visit or you have normal blood pressure with other risk factors:  You may be asked to return on a different day to have your blood pressure checked again.  You may be asked to monitor your blood pressure at home for 1 week or longer. If you are diagnosed with hypertension, you may have other blood or imaging tests to help your health care provider understand your overall risk for other conditions. How is this treated? This condition is treated by making healthy lifestyle changes, such as eating healthy foods, exercising more, and reducing your alcohol intake. Your health   care provider may prescribe medicine if lifestyle changes are not enough to get your blood pressure under control, and if:  Your systolic blood pressure is above 130.  Your diastolic blood pressure is above 80. Your personal target blood pressure may vary depending on your medical conditions, your age, and other factors. Follow these instructions  at home: Eating and drinking   Eat a diet that is high in fiber and potassium, and low in sodium, added sugar, and fat. An example eating plan is called the DASH (Dietary Approaches to Stop Hypertension) diet. To eat this way:  Eat plenty of fresh fruits and vegetables. Try to fill half of your plate at each meal with fruits and vegetables.  Eat whole grains, such as whole wheat pasta, brown rice, or whole grain bread. Fill about one quarter of your plate with whole grains.  Eat or drink low-fat dairy products, such as skim milk or low-fat yogurt.  Avoid fatty cuts of meat, processed or cured meats, and poultry with skin. Fill about one quarter of your plate with lean proteins, such as fish, chicken without skin, beans, eggs, and tofu.  Avoid premade and processed foods. These tend to be higher in sodium, added sugar, and fat.  Reduce your daily sodium intake. Most people with hypertension should eat less than 1,500 mg of sodium a day.  Limit alcohol intake to no more than 1 drink a day for nonpregnant women and 2 drinks a day for men. One drink equals 12 oz of beer, 5 oz of wine, or 1 oz of hard liquor. Lifestyle   Work with your health care provider to maintain a healthy body weight or to lose weight. Ask what an ideal weight is for you.  Get at least 30 minutes of exercise that causes your heart to beat faster (aerobic exercise) most days of the week. Activities may include walking, swimming, or biking.  Include exercise to strengthen your muscles (resistance exercise), such as pilates or lifting weights, as part of your weekly exercise routine. Try to do these types of exercises for 30 minutes at least 3 days a week.  Do not use any products that contain nicotine or tobacco, such as cigarettes and e-cigarettes. If you need help quitting, ask your health care provider.  Monitor your blood pressure at home as told by your health care provider.  Keep all follow-up visits as told by  your health care provider. This is important. Medicines   Take over-the-counter and prescription medicines only as told by your health care provider. Follow directions carefully. Blood pressure medicines must be taken as prescribed.  Do not skip doses of blood pressure medicine. Doing this puts you at risk for problems and can make the medicine less effective.  Ask your health care provider about side effects or reactions to medicines that you should watch for. Contact a health care provider if:  You think you are having a reaction to a medicine you are taking.  You have headaches that keep coming back (recurring).  You feel dizzy.  You have swelling in your ankles.  You have trouble with your vision. Get help right away if:  You develop a severe headache or confusion.  You have unusual weakness or numbness.  You feel faint.  You have severe pain in your chest or abdomen.  You vomit repeatedly.  You have trouble breathing. Summary  Hypertension is when the force of blood pumping through your arteries is too strong. If this condition is   not controlled, it may put you at risk for serious complications.  Your personal target blood pressure may vary depending on your medical conditions, your age, and other factors. For most people, a normal blood pressure is less than 120/80.  Hypertension is treated with lifestyle changes, medicines, or a combination of both. Lifestyle changes include weight loss, eating a healthy, low-sodium diet, exercising more, and limiting alcohol. This information is not intended to replace advice given to you by your health care provider. Make sure you discuss any questions you have with your health care provider. Document Released: 06/17/2005 Document Revised: 05/15/2016 Document Reviewed: 05/15/2016 Elsevier Interactive Patient Education  2017 [employer]     IF you received an x-ray today, you will receive an invoice from [facility].  Please contact [facility] at [phone] with questions or concerns regarding your invoice.   IF you received labwork today, you will receive an invoice from [facility]. Please contact [facility] at [phone] with questions or concerns regarding your invoice.   Our billing staff will not be able to assist you with questions regarding bills from these companies.  You will be contacted with the lab results as soon as they are available. The fastest way to get your results is to activate your My Chart account. Instructions are located on the last page of this paperwork. If you have not heard from us regarding the results in 2 weeks, please contact this office.      

## 2016-09-05 NOTE — Progress Notes (Signed)
    MRN: [id number] DOB: 1956/01/25  Subjective:   Arnetha MassyJohn H Angst is a 61 y.o. male presenting for follow up on HTN. Patient has taken lisinopril 10mg  daily. Reports having had neck pain, stiffness since starting the medication. He drinks 3 cups of coffee daily. Drinks ~1/2 gallon of water daily. Works 7 days a week, is very physically active, builds signs, works with wood. Denies dizziness, chronic headache, blurred vision, chest pain, shortness of breath, heart racing, palpitations, nausea, vomiting, abdominal pain, hematuria, lower leg swelling. Denies smoking cigarettes.  Jerrol has a current medication list which includes the following prescription(s): aspirin ec and lisinopril. Also has No Known Allergies.  Keshan  has no past medical history on file. Also  has a past surgical history that includes Appendectomy (1973); Shoulder surgery (2008); Fracture surgery (Left); and Vasectomy.  Objective:   Vitals: BP (!) 142/78   Pulse (!) 59   Temp 97.8 F (36.6 C) (Oral)   Resp 16   Ht 5\' 10"  (1.778 m)   Wt 188 lb (85.3 kg)   SpO2 98%   BMI 26.98 kg/m   BP Readings from Last 3 Encounters:  09/05/16 (!) 142/78  08/08/16 (!) 160/90  02/15/15 140/78   Physical Exam  Constitutional: He is oriented to person, place, and time. He appears well-developed and well-nourished.  HENT:  Mouth/Throat: Oropharynx is clear and moist.  Eyes: No scleral icterus.  Cardiovascular: Normal rate, regular rhythm and intact distal pulses.  Exam reveals no gallop and no friction rub.   No murmur heard. Pulmonary/Chest: No respiratory distress. He has no wheezes. He has no rales.  Neurological: He is alert and oriented to person, place, and time.  Skin: Skin is warm and dry.   Assessment and Plan :   1. Essential hypertension - Improved, labs pending. Will add on amlodipine 5mg . Recheck in 4 weeks. - Basic metabolic panel - amLODipine (NORVASC) 5 MG tablet; Take 1 tablet (5 mg total) by mouth daily.   Dispense: 90 tablet; Refill: 3  2. Neck pain 3. Neck stiffness - Labs pending. I do not believe this is related to lisinopril or HTN. I recommended patient hydrate much better. Use conservative management for pain. He declines medications for his neck pain/stiffness.  4. Family history of heart disease - Patient is taking aspirin 81mg  daily and managing BP as above.  Wallis BambergMario Herby Amick, PA-C [facility] [phone] 09/05/2016 9:08 AM

## 2016-09-06 LAB — BASIC METABOLIC PANEL
BUN / CREAT RATIO: 13 (ref 10–24)
BUN: 13 mg/dL (ref 8–27)
CO2: 25 mmol/L (ref 18–29)
CREATININE: 1.02 mg/dL (ref 0.76–1.27)
Calcium: 9.5 mg/dL (ref 8.6–10.2)
Chloride: 100 mmol/L (ref 96–106)
GFR calc Af Amer: 91 mL/min/{1.73_m2} (ref >59)
GFR calc non Af Amer: 78 mL/min/{1.73_m2} (ref >59)
GLUCOSE: 81 mg/dL (ref 65–99)
Potassium: 4.5 mmol/L (ref 3.5–5.2)
SODIUM: 141 mmol/L (ref 134–144)

## 2016-10-03 ENCOUNTER — Encounter: Payer: Self-pay | Admitting: Urgent Care

## 2016-10-03 ENCOUNTER — Ambulatory Visit (INDEPENDENT_AMBULATORY_CARE_PROVIDER_SITE_OTHER): Payer: BLUE CROSS/BLUE SHIELD | Admitting: Urgent Care

## 2016-10-03 VITALS — BP 165/77 | HR 60 | Temp 98.0°F | Resp 16 | Ht 70.0 in | Wt 189.0 lb

## 2016-10-03 DIAGNOSIS — R030 Elevated blood-pressure reading, without diagnosis of hypertension: Secondary | ICD-10-CM | POA: Diagnosis not present

## 2016-10-03 DIAGNOSIS — I10 Essential (primary) hypertension: Secondary | ICD-10-CM

## 2016-10-03 DIAGNOSIS — H6123 Impacted cerumen, bilateral: Secondary | ICD-10-CM | POA: Diagnosis not present

## 2016-10-03 HISTORY — DX: Essential (primary) hypertension: I10

## 2016-10-03 MED ORDER — LISINOPRIL-HYDROCHLOROTHIAZIDE 10-12.5 MG PO TABS: 1.0000 | ORAL_TABLET | Freq: Every day | ORAL | 1 refills | Status: DC

## 2016-10-03 NOTE — Progress Notes (Signed)
   MRN: [id number] DOB: 11-Aug-1955  Subjective:   Brendan Wagner is a 61 y.o. male presenting for follow up on HTN, would also like his ear to be cleaned from ear wax. Patient is currently on lisinopril , amlodipine . Does not actively avoid salt, eats out at restaurants but stays very active. Admits emotional stress, lives with his in laws and has stress with them. However, admits good relationships at home overall. Denies dizziness, chronic headache, blurred vision, chest pain, shortness of breath, heart racing, palpitations, nausea, vomiting, abdominal pain, hematuria, lower leg swelling. Denies smoking cigarettes.  Brendan Wagner has a current medication list which includes the following prescription(s): amlodipine, aspirin ec, and lisinopril. Also has No Known Allergies. Brendan Wagner  has no past medical history on file. Also  has a past surgical history that includes Appendectomy (1973); Shoulder surgery (2008); Fracture surgery (Left); and Vasectomy.  Objective:   Vitals: BP (!) 165/77   Pulse 60   Temp 98 F (36.7 C) (Oral)   Resp 16   Ht  (1.778 m)   Wt 189 lb (85.7 kg)   SpO2 98%   BMI 27.12 kg/m   Recheck of BP was 158/78 by PA-Yulian Gosney.  Physical Exam  Constitutional: He is oriented to person, place, and time. He appears well-developed and well-nourished.  HENT:  Bilateral cerumen impaction.  Eyes: No scleral icterus.  Cardiovascular: Normal rate, regular rhythm and intact distal pulses.  Exam reveals no gallop and no friction rub.   No murmur heard. Pulmonary/Chest: No respiratory distress. He has no wheezes. He has no rales.  Musculoskeletal: He exhibits no edema.  Neurological: He is alert and oriented to person, place, and time.  Skin: Skin is warm and dry.  Psychiatric: He has a normal mood and affect.   Assessment and Plan :   1. Essential hypertension 2. Elevated blood pressure reading - Patient was not taking lisinopril due to miscommunication at the pharmacy. He  will continue amlodipine. Restart lisinopril but with hydrochlorothiazide. Will be more vigilant about salt intake and recheck in 4 weeks.  3. Bilateral impacted cerumen - Ear lavage performed today.  Wallis Bamberg, PA-C [facility] [phone] 10/03/2016 8:33 AM

## 2016-10-03 NOTE — Patient Instructions (Addendum)
For you blood pressure control, take the combination medication of hydrochlorothiazide-lisinopril once daily and amlodipine once daily.    Hypertension Hypertension, commonly called high blood pressure, is when the force of blood pumping through the arteries is too strong. The arteries are the blood vessels that carry blood from the heart throughout the body. Hypertension forces the heart to work harder to pump blood and may cause arteries to become narrow or stiff. Having untreated or uncontrolled hypertension can cause heart attacks, strokes, kidney disease, and other problems. A blood pressure reading consists of a higher number over a lower number. Ideally, your blood pressure should be below 120/80. The first ("top") number is called the systolic pressure. It is a measure of the pressure in your arteries as your heart beats. The second ("bottom") number is called the diastolic pressure. It is a measure of the pressure in your arteries as the heart relaxes. What are the causes? The cause of this condition is not known. What increases the risk? Some risk factors for high blood pressure are under your control. Others are not. Factors you can change   Smoking.  Having type 2 diabetes mellitus, high cholesterol, or both.  Not getting enough exercise or physical activity.  Being overweight.  Having too much fat, sugar, calories, or salt (sodium) in your diet.  Drinking too much alcohol. Factors that are difficult or impossible to change   Having chronic kidney disease.  Having a family history of high blood pressure.  Age. Risk increases with age.  Race. You may be at higher risk if you are African-American.  Gender. Men are at higher risk than women before age 85. After age 57, women are at higher risk than men.  Having obstructive sleep apnea.  Stress. What are the signs or symptoms? Extremely high blood pressure (hypertensive crisis) may  cause:  Headache.  Anxiety.  Shortness of breath.  Nosebleed.  Nausea and vomiting.  Severe chest pain.  Jerky movements you cannot control (seizures). How is this diagnosed? This condition is diagnosed by measuring your blood pressure while you are seated, with your arm resting on a surface. The cuff of the blood pressure monitor will be placed directly against the skin of your upper arm at the level of your heart. It should be measured at least twice using the same arm. Certain conditions can cause a difference in blood pressure between your right and left arms. Certain factors can cause blood pressure readings to be lower or higher than normal (elevated) for a short period of time:  When your blood pressure is higher when you are in a health care provider's office than when you are at home, this is called white coat hypertension. Most people with this condition do not need medicines.  When your blood pressure is higher at home than when you are in a health care provider's office, this is called masked hypertension. Most people with this condition may need medicines to control blood pressure. If you have a high blood pressure reading during one visit or you have normal blood pressure with other risk factors:  You may be asked to return on a different day to have your blood pressure checked again.  You may be asked to monitor your blood pressure at home for 1 week or longer. If you are diagnosed with hypertension, you may have other blood or imaging tests to help your health care provider understand your overall risk for other conditions. How is this treated? This condition is  treated by making healthy lifestyle changes, such as eating healthy foods, exercising more, and reducing your alcohol intake. Your health care provider may prescribe medicine if lifestyle changes are not enough to get your blood pressure under control, and if:  Your systolic blood pressure is above 130.  Your  diastolic blood pressure is above 80. Your personal target blood pressure may vary depending on your medical conditions, your age, and other factors. Follow these instructions at home: Eating and drinking   Eat a diet that is high in fiber and potassium, and low in sodium, added sugar, and fat. An example eating plan is called the DASH (Dietary Approaches to Stop Hypertension) diet. To eat this way:  Eat plenty of fresh fruits and vegetables. Try to fill half of your plate at each meal with fruits and vegetables.  Eat whole grains, such as whole wheat pasta, brown rice, or whole grain bread. Fill about one quarter of your plate with whole grains.  Eat or drink low-fat dairy products, such as skim milk or low-fat yogurt.  Avoid fatty cuts of meat, processed or cured meats, and poultry with skin. Fill about one quarter of your plate with lean proteins, such as fish, chicken without skin, beans, eggs, and tofu.  Avoid premade and processed foods. These tend to be higher in sodium, added sugar, and fat.  Reduce your daily sodium intake. Most people with hypertension should eat less than 1,500 mg of sodium a day.  Limit alcohol intake to no more than 1 drink a day for nonpregnant women and 2 drinks a day for men. One drink equals 12 oz of beer, 5 oz of wine, or 1 oz of hard liquor. Lifestyle   Work with your health care provider to maintain a healthy body weight or to lose weight. Ask what an ideal weight is for you.  Get at least 30 minutes of exercise that causes your heart to beat faster (aerobic exercise) most days of the week. Activities may include walking, swimming, or biking.  Include exercise to strengthen your muscles (resistance exercise), such as pilates or lifting weights, as part of your weekly exercise routine. Try to do these types of exercises for 30 minutes at least 3 days a week.  Do not use any products that contain nicotine or tobacco, such as cigarettes and e-cigarettes.  If you need help quitting, ask your health care provider.  Monitor your blood pressure at home as told by your health care provider.  Keep all follow-up visits as told by your health care provider. This is important. Medicines   Take over-the-counter and prescription medicines only as told by your health care provider. Follow directions carefully. Blood pressure medicines must be taken as prescribed.  Do not skip doses of blood pressure medicine. Doing this puts you at risk for problems and can make the medicine less effective.  Ask your health care provider about side effects or reactions to medicines that you should watch for. Contact a health care provider if:  You think you are having a reaction to a medicine you are taking.  You have headaches that keep coming back (recurring).  You feel dizzy.  You have swelling in your ankles.  You have trouble with your vision. Get help right away if:  You develop a severe headache or confusion.  You have unusual weakness or numbness.  You feel faint.  You have severe pain in your chest or abdomen.  You vomit repeatedly.  You have trouble breathing.  Summary  Hypertension is when the force of blood pumping through your arteries is too strong. If this condition is not controlled, it may put you at risk for serious complications.  Your personal target blood pressure may vary depending on your medical conditions, your age, and other factors. For most people, a normal blood pressure is less than 120/80.  Hypertension is treated with lifestyle changes, medicines, or a combination of both. Lifestyle changes include weight loss, eating a healthy, low-sodium diet, exercising more, and limiting alcohol. This information is not intended to replace advice given to you by your health care provider. Make sure you discuss any questions you have with your health care provider. Document Released: 06/17/2005 Document Revised: 05/15/2016 Document  Reviewed: 05/15/2016 Elsevier Interactive Patient Education  2017 [employer].     IF you received an x-ray today, you will receive an invoice from [facility]. Please contact [facility] at [phone] with questions or concerns regarding your invoice.   IF you received labwork today, you will receive an invoice from [facility]. Please contact [facility] at [phone] with questions or concerns regarding your invoice.   Our billing staff will not be able to assist you with questions regarding bills from these companies.  You will be contacted with the lab results as soon as they are available. The fastest way to get your results is to activate your My Chart account. Instructions are located on the last page of this paperwork. If you have not heard from [place] regarding the results in 2 weeks, please contact this office.

## 2016-12-05 ENCOUNTER — Ambulatory Visit: Payer: BLUE CROSS/BLUE SHIELD | Admitting: Urgent Care

## 2017-01-17 ENCOUNTER — Encounter: Payer: Self-pay | Admitting: Urgent Care

## 2017-01-17 ENCOUNTER — Ambulatory Visit (INDEPENDENT_AMBULATORY_CARE_PROVIDER_SITE_OTHER): Payer: BLUE CROSS/BLUE SHIELD | Admitting: Urgent Care

## 2017-01-17 VITALS — BP 151/80 | HR 57 | Temp 99.1°F | Resp 16 | Ht 69.5 in | Wt 188.8 lb

## 2017-01-17 DIAGNOSIS — N529 Male erectile dysfunction, unspecified: Secondary | ICD-10-CM

## 2017-01-17 DIAGNOSIS — M1990 Unspecified osteoarthritis, unspecified site: Secondary | ICD-10-CM

## 2017-01-17 DIAGNOSIS — R030 Elevated blood-pressure reading, without diagnosis of hypertension: Secondary | ICD-10-CM | POA: Diagnosis not present

## 2017-01-17 DIAGNOSIS — I10 Essential (primary) hypertension: Secondary | ICD-10-CM | POA: Diagnosis not present

## 2017-01-17 MED ORDER — AMLODIPINE BESYLATE 5 MG PO TABS: 5.0000 mg | ORAL_TABLET | Freq: Every day | ORAL | 1 refills | Status: DC

## 2017-01-17 MED ORDER — MELOXICAM 7.5 MG PO TABS: 7.5000 mg | ORAL_TABLET | Freq: Every day | ORAL | 1 refills | Status: DC

## 2017-01-17 MED ORDER — LISINOPRIL-HYDROCHLOROTHIAZIDE 20-12.5 MG PO TABS: 1.0000 | ORAL_TABLET | Freq: Every day | ORAL | 1 refills | Status: DC

## 2017-01-17 NOTE — Patient Instructions (Addendum)
Hypertension Hypertension, commonly called high blood pressure, is when the force of blood pumping through the arteries is too strong. The arteries are the blood vessels that carry blood from the heart throughout the body. Hypertension forces the heart to work harder to pump blood and may cause arteries to become narrow or stiff. Having untreated or uncontrolled hypertension can cause heart attacks, strokes, kidney disease, and other problems. A blood pressure reading consists of a higher number over a lower number. Ideally, your blood pressure should be below 120/80. The first ("top") number is called the systolic pressure. It is a measure of the pressure in your arteries as your heart beats. The second ("bottom") number is called the diastolic pressure. It is a measure of the pressure in your arteries as the heart relaxes. What are the causes? The cause of this condition is not known. What increases the risk? Some risk factors for high blood pressure are under your control. Others are not. Factors you can change  Smoking.  Having type 2 diabetes mellitus, high cholesterol, or both.  Not getting enough exercise or physical activity.  Being overweight.  Having too much fat, sugar, calories, or salt (sodium) in your diet.  Drinking too much alcohol. Factors that are difficult or impossible to change  Having chronic kidney disease.  Having a family history of high blood pressure.  Age. Risk increases with age.  Race. You may be at higher risk if you are African-American.  Gender. Men are at higher risk than women before age 45. After age 65, women are at higher risk than men.  Having obstructive sleep apnea.  Stress. What are the signs or symptoms? Extremely high blood pressure (hypertensive crisis) may cause:  Headache.  Anxiety.  Shortness of breath.  Nosebleed.  Nausea and vomiting.  Severe chest pain.  Jerky movements you cannot control (seizures).  How is this  diagnosed? This condition is diagnosed by measuring your blood pressure while you are seated, with your arm resting on a surface. The cuff of the blood pressure monitor will be placed directly against the skin of your upper arm at the level of your heart. It should be measured at least twice using the same arm. Certain conditions can cause a difference in blood pressure between your right and left arms. Certain factors can cause blood pressure readings to be lower or higher than normal (elevated) for a short period of time:  When your blood pressure is higher when you are in a health care provider's office than when you are at home, this is called white coat hypertension. Most people with this condition do not need medicines.  When your blood pressure is higher at home than when you are in a health care provider's office, this is called masked hypertension. Most people with this condition may need medicines to control blood pressure.  If you have a high blood pressure reading during one visit or you have normal blood pressure with other risk factors:  You may be asked to return on a different day to have your blood pressure checked again.  You may be asked to monitor your blood pressure at home for 1 week or longer.  If you are diagnosed with hypertension, you may have other blood or imaging tests to help your health care provider understand your overall risk for other conditions. How is this treated? This condition is treated by making healthy lifestyle changes, such as eating healthy foods, exercising more, and reducing your alcohol intake. Your   health care provider may prescribe medicine if lifestyle changes are not enough to get your blood pressure under control, and if:  Your systolic blood pressure is above 130.  Your diastolic blood pressure is above 80.  Your personal target blood pressure may vary depending on your medical conditions, your age, and other factors. Follow these  instructions at home: Eating and drinking  Eat a diet that is high in fiber and potassium, and low in sodium, added sugar, and fat. An example eating plan is called the DASH (Dietary Approaches to Stop Hypertension) diet. To eat this way: ? Eat plenty of fresh fruits and vegetables. Try to fill half of your plate at each meal with fruits and vegetables. ? Eat whole grains, such as whole wheat pasta, brown rice, or whole grain bread. Fill about one quarter of your plate with whole grains. ? Eat or drink low-fat dairy products, such as skim milk or low-fat yogurt. ? Avoid fatty cuts of meat, processed or cured meats, and poultry with skin. Fill about one quarter of your plate with lean proteins, such as fish, chicken without skin, beans, eggs, and tofu. ? Avoid premade and processed foods. These tend to be higher in sodium, added sugar, and fat.  Reduce your daily sodium intake. Most people with hypertension should eat less than 1,500 mg of sodium a day.  Limit alcohol intake to no more than 1 drink a day for nonpregnant women and 2 drinks a day for men. One drink equals 12 oz of beer, 5 oz of wine, or 1 oz of hard liquor. Lifestyle  Work with your health care provider to maintain a healthy body weight or to lose weight. Ask what an ideal weight is for you.  Get at least 30 minutes of exercise that causes your heart to beat faster (aerobic exercise) most days of the week. Activities may include walking, swimming, or biking.  Include exercise to strengthen your muscles (resistance exercise), such as pilates or lifting weights, as part of your weekly exercise routine. Try to do these types of exercises for 30 minutes at least 3 days a week.  Do not use any products that contain nicotine or tobacco, such as cigarettes and e-cigarettes. If you need help quitting, ask your health care provider.  Monitor your blood pressure at home as told by your health care provider.  Keep all follow-up visits as  told by your health care provider. This is important. Medicines  Take over-the-counter and prescription medicines only as told by your health care provider. Follow directions carefully. Blood pressure medicines must be taken as prescribed.  Do not skip doses of blood pressure medicine. Doing this puts you at risk for problems and can make the medicine less effective.  Ask your health care provider about side effects or reactions to medicines that you should watch for. Contact a health care provider if:  You think you are having a reaction to a medicine you are taking.  You have headaches that keep coming back (recurring).  You feel dizzy.  You have swelling in your ankles.  You have trouble with your vision. Get help right away if:  You develop a severe headache or confusion.  You have unusual weakness or numbness.  You feel faint.  You have severe pain in your chest or abdomen.  You vomit repeatedly.  You have trouble breathing. Summary  Hypertension is when the force of blood pumping through your arteries is too strong. If this condition is not   controlled, it may put you at risk for serious complications.  Your personal target blood pressure may vary depending on your medical conditions, your age, and other factors. For most people, a normal blood pressure is less than 120/80.  Hypertension is treated with lifestyle changes, medicines, or a combination of both. Lifestyle changes include weight loss, eating a healthy, low-sodium diet, exercising more, and limiting alcohol. This information is not intended to replace advice given to you by your health care provider. Make sure you discuss any questions you have with your health care provider. Document Released: 06/17/2005 Document Revised: 05/15/2016 Document Reviewed: 05/15/2016 Elsevier Interactive Patient Education  2018 [employer].     Erectile Dysfunction Erectile dysfunction (ED) is the inability to get or keep  an erection in order to have sexual intercourse. Erectile dysfunction may include:  Inability to get an erection.  Lack of enough hardness of the erection to allow penetration.  Loss of the erection before sex is finished.  What are the causes? This condition may be caused by:  Certain medicines, such as: ? Pain relievers. ? Antihistamines. ? Antidepressants. ? Blood pressure medicines. ? Water pills (diuretics). ? Ulcer medicines. ? Muscle relaxants. ? Drugs.  Excessive drinking.  Psychological causes, such as: ? Anxiety. ? Depression. ? Sadness. ? Exhaustion. ? Performance fear. ? Stress.  Physical causes, such as: ? Artery problems. This may include diabetes, smoking, liver disease, or atherosclerosis. ? High blood pressure. ? Hormonal problems, such as low testosterone. ? Obesity. ? Nerve problems. This may include back or pelvic injuries, diabetes mellitus, multiple sclerosis, or Parkinson disease.  What are the signs or symptoms? Symptoms of this condition include:  Inability to get an erection.  Lack of enough hardness of the erection to allow penetration.  Loss of the erection before sex is finished.  Normal erections at some times, but with frequent unsatisfactory episodes.  Low sexual satisfaction in either partner due to erection problems.  A curved penis occurring with erection. The curve may cause pain or the penis may be too curved to allow for intercourse.  Never having nighttime erections.  How is this diagnosed? This condition is often diagnosed by:  Performing a physical exam to find other diseases or specific problems with the penis.  Asking you detailed questions about the problem.  Performing blood tests to check for diabetes mellitus or to measure hormone levels.  Performing other tests to check for underlying health conditions.  Performing an ultrasound exam to check for scarring.  Performing a test to check blood flow to the  penis.  Doing a sleep study at home to measure nighttime erections.  How is this treated? This condition may be treated by:  Medicine taken by mouth to help you achieve an erection (oral medicine).  Hormone replacement therapy to replace low testosterone levels.  Medicine that is injected into the penis. Your health care provider may instruct you how to give yourself these injections at home.  Vacuum pump. This is a pump with a ring on it. The pump and ring are placed on the penis and used to create pressure that helps the penis become erect.  Penile implant surgery. In this procedure, you may receive: ? An inflatable implant. This consists of cylinders, a pump, and a reservoir. The cylinders can be inflated with a fluid that helps to create an erection, and they can be deflated after intercourse. ? A semi-rigid implant. This consists of two silicone rubber rods. The rods provide some  rigidity. They are also flexible, so the penis can both curve downward in its normal position and become straight for sexual intercourse.  Blood vessel surgery, to improve blood flow to the penis. During this procedure, a blood vessel from a different part of the body is placed into the penis to allow blood to flow around (bypass) damaged or blocked blood vessels.  Lifestyle changes, such as exercising more, losing weight, and quitting smoking.  Follow these instructions at home: Medicines  Take over-the-counter and prescription medicines only as told by your health care provider. Do not increase the dosage without first discussing it with your health care provider.  If you are using self-injections, perform injections as directed by your health care provider. Make sure to avoid any veins that are on the surface of the penis. After giving an injection, apply pressure to the injection site for 5 minutes. General instructions  Exercise regularly, as directed by your health care provider. Work with your  health care provider to lose weight, if needed.  Do not use any products that contain nicotine or tobacco, such as cigarettes and e-cigarettes. If you need help quitting, ask your health care provider.  Before using a vacuum pump, read the instructions that come with the pump and discuss any questions with your health care provider.  Keep all follow-up visits as told by your health care provider. This is important. Contact a health care provider if:  You feel nauseous.  You vomit. Get help right away if:  You are taking oral or injectable medicines and you have an erection that lasts longer than 4 hours. If your health care provider is unavailable, go to the nearest emergency room for evaluation. An erection that lasts much longer than 4 hours can result in permanent damage to your penis.  You have severe pain in your groin or abdomen.  You develop redness or severe swelling of your penis.  You have redness spreading up into your groin or lower abdomen.  You are unable to urinate.  You experience chest pain or a rapid heart beat (palpitations) after taking oral medicines. Summary  Erectile dysfunction (ED) is the inability to get or keep an erection during sexual intercourse. This problem can usually be treated successfully.  This condition is diagnosed based on a physical exam, your symptoms, and tests to determine the cause. Treatment varies depending on the cause, and may include medicines, hormone therapy, surgery, or vacuum pump.  You may need follow-up visits to make sure that you are using your medicines or devices correctly.  Get help right away if you are taking or injecting medicines and you have an erection that lasts longer than 4 hours. This information is not intended to replace advice given to you by your health care provider. Make sure you discuss any questions you have with your health care provider. Document Released: 06/14/2000 Document Revised: 07/03/2016  Document Reviewed: 07/03/2016 Elsevier Interactive Patient Education  2017 Elsevier Inc.    Sildenafil tablets (Revatio) What is this medicine? SILDENAFIL (sil DEN a fil) is used to treat pulmonary arterial hypertension. This is a serious heart and lung condition. This medicine helps to improve symptoms and quality of life. This medicine may be used for other purposes; ask your health care provider or pharmacist if you have questions. COMMON BRAND NAME(S): Revatio What should I tell my health care provider before I take this medicine? They need to know if you have any of these conditions: -anatomical deformation of the penis,  Peyronie's disease, or history of priapism (painful and prolonged erection) -bleeding disorders -eye disease, vision problems -heart disease -high or low blood pressure -history of blood diseases, like sickle cell anemia or leukemia -kidney disease -liver disease -pulmonary veno-occlusive disease (PVOD) -stomach ulcer -an unusual or allergic reaction to sildenafil, other medicines, foods, dyes, or preservatives -pregnant or trying to get pregnant -breast-feeding How should I use this medicine? Take this medicine by mouth with a glass of water. Follow the directions on the prescription label. You can take it with or without food. If it upsets your stomach, take it with food. Take your doses at regular intervals about 4 to 6 hours apart. Do not take it more often than directed. Do not stop taking except on your doctor's advice. Talk to your pediatrician regarding the use of this medicine in children. This medicine is not approved for use in children. Overdosage: If you think you have taken too much of this medicine contact a poison control center or emergency room at once. NOTE: This medicine is only for you. Do not share this medicine with others. What if I miss a dose? If you miss a dose, take it as soon as you can. If it is almost time for your next dose, take  only that dose. Do not take double or extra doses. What may interact with this medicine? Do not take this medicine with any of the following medications: -cisapride -cobicistat -nitrates like amyl nitrite, isosorbide dinitrate, isosorbide mononitrate, nitroglycerin -riociguat -telaprevir This medicine may also interact with the following medications: -antiviral medicines for HIV or AIDS -bosentan -certain medicines for benign prostatic hyperplasia (BPH) -certain medicines for blood pressure -certain medicines for fungal infections like ketoconazole and itraconazole -cimetidine -erythromycin -rifampin This list may not describe all possible interactions. Give your health care provider a list of all the medicines, herbs, non-prescription drugs, or dietary supplements you use. Also tell them if you smoke, drink alcohol, or use illegal drugs. Some items may interact with your medicine. What should I watch for while using this medicine? Tell your doctor or healthcare professional if your symptoms do not start to get better or if they get worse. Tell your doctor or health care professional right away if you have any change in your eyesight or hearing. You may get dizzy. Do not drive, use machinery, or do anything that needs mental alertness until you know how this medicine affects you. Do not stand or sit up quickly, especially if you are an older patient. This reduces the risk of dizzy or fainting spells. Avoid alcoholic drinks; they can make you more dizzy. What side effects may I notice from receiving this medicine? Side effects that you should report to your doctor or health care professional as soon as possible: -allergic reactions like skin rash, itching or hives, swelling of the face, lips, or tongue -breathing problems -changes in vision -chest pain -decreased hearing -fast, irregular heartbeat -men: prolonged or painful erection (lasting more than 4 hours) Side effects that usually  do not require medical attention (report to your doctor or health care professional if they continue or are bothersome): -facial flushing -headache -nosebleed -trouble sleeping -upset stomach This list may not describe all possible side effects. Call your doctor for medical advice about side effects. You may report side effects to FDA at 1-800-FDA-1088. Where should I keep my medicine? Keep out of reach of children. Store at room temperature between 15 and 30 degrees C (59 and 86 degrees F). Throw away  any unused medicine after the expiration date. NOTE: This sheet is a summary. It may not cover all possible information. If you have questions about this medicine, talk to your doctor, pharmacist, or health care provider.  2018 Elsevier/Gold Standard (2015-05-31 17:18:06)     IF you received an x-ray today, you will receive an invoice from [facility]. Please contact [facility] at [phone] with questions or concerns regarding your invoice.   IF you received labwork today, you will receive an invoice from [facility]. Please contact [facility] at [phone] with questions or concerns regarding your invoice.   Our billing staff will not be able to assist you with questions regarding bills from these companies.  You will be contacted with the lab results as soon as they are available. The fastest way to get your results is to activate your My Chart account. Instructions are located on the last page of this paperwork. If you have not heard from [place] regarding the results in 2 weeks, please contact this office.

## 2017-01-17 NOTE — Progress Notes (Addendum)
    MRN: [id number] DOB: 1956/06/18  Subjective:   Brendan Wagner is a 61 y.o. male presenting for follow up on HTN. His last OV, patient was started on lis-HCTZ, maintained his amlodipine. Avoids salt in his diet. Has made dietary modifications. Patient stays very active with his work. Denies dizziness, chronic headache, blurred vision, chest pain, shortness of breath, heart racing, palpitations, nausea, vomiting, abdominal pain, hematuria, lower leg swelling. Denies smoking cigarettes.  ED - Reports that he is having a difficult time obtaining erections. He would like information regarding medical therapy. Admits that he works a lot, is very active, does 7 days a week. Denies ever having issues with this. Denies history of HL, does not smoke.  Arthritis - Has a longstanding history of bilateral shoulder and knee arthritis. He has had surgery of his left shoulder, needs surgery for his right. Has seen ortho and has been advised to care for his body while working. However, his busy lifestyle has caused worsening shoulder and knee pain. He has used meloxicam with great results and would like to get a refill. Denies history of liver disease or kidney disease.   Brendan Wagner has a current medication list which includes the following prescription(s): amlodipine, aspirin ec, and lisinopril-hydrochlorothiazide. Also has No Known Allergies. Brendan Wagner  has no past medical history on file. Also  has a past surgical history that includes Appendectomy (1973); Shoulder surgery (2008); Fracture surgery (Left); and Vasectomy.  Objective:   Vitals: BP (!) 154/80   Pulse (!) 55   Temp 99.1 F (37.3 C) (Oral)   Resp 16   Ht 5' 9.5" (1.765 m)   Wt 188 lb 12.8 oz (85.6 kg)   SpO2 97%   BMI 27.48 kg/m   BP Readings from Last 3 Encounters:  01/17/17 (!) 154/80  10/03/16 (!) 165/77  09/05/16 (!) 142/78   Physical Exam  Constitutional: He is oriented to person, place, and time. He appears well-developed and  well-nourished.  HENT:  Mouth/Throat: Oropharynx is clear and moist.  Eyes: Pupils are equal, round, and reactive to light. No scleral icterus.  Neck: Normal range of motion. Neck supple. No thyromegaly present.  Cardiovascular: Normal rate, regular rhythm, normal heart sounds and intact distal pulses.  Exam reveals no gallop and no friction rub.   No murmur heard. Pulmonary/Chest: Breath sounds normal. No respiratory distress. He has no wheezes. He has no rales.  Abdominal: Soft. Bowel sounds are normal. He exhibits no distension and no mass. There is no tenderness. There is no guarding.  Musculoskeletal: He exhibits no edema.  Neurological: He is alert and oriented to person, place, and time.  Skin: Skin is warm and dry.   Assessment and Plan :   1. Essential hypertension 2. Elevated blood pressure reading - Patient has made dietary modifications. We will increase his lisinopril to 20mg , maintain HCTZ, amlodipine. Recheck in 4 weeks.  - Microalbumin / creatinine urine ratio  3. Arthritis - Provided with script for meloxicam, offered re-evaluation of his arthritis if he has a hard time with pain despite meloxicam and rest. Labs pending.  4. Erectile dysfunction, unspecified erectile dysfunction type - Counseled on medical therapy. Counseled patient on potential for adverse effects with medications used for ED. Patient will review this information and let me know if he would like to pursue this route.   Wallis BambergMario Rushie Brazel, PA-C [facility] [phone] 01/17/2017 4:02 PM

## 2017-01-18 LAB — COMPREHENSIVE METABOLIC PANEL
A/G RATIO: 1.8 (ref 1.2–2.2)
ALT: 16 IU/L (ref 0–44)
AST: 22 IU/L (ref 0–40)
Albumin: 4.5 g/dL (ref 3.6–4.8)
Alkaline Phosphatase: 67 IU/L (ref 39–117)
BILIRUBIN TOTAL: 0.3 mg/dL (ref 0.0–1.2)
BUN/Creatinine Ratio: 14 (ref 10–24)
BUN: 14 mg/dL (ref 8–27)
CALCIUM: 9.6 mg/dL (ref 8.6–10.2)
CHLORIDE: 103 mmol/L (ref 96–106)
CO2: 24 mmol/L (ref 20–29)
Creatinine, Ser: 1.03 mg/dL (ref 0.76–1.27)
GFR, EST AFRICAN AMERICAN: 90 mL/min/{1.73_m2} (ref >59)
GFR, EST NON AFRICAN AMERICAN: 77 mL/min/{1.73_m2} (ref >59)
GLOBULIN, TOTAL: 2.5 g/dL (ref 1.5–4.5)
Glucose: 84 mg/dL (ref 65–99)
POTASSIUM: 4.2 mmol/L (ref 3.5–5.2)
SODIUM: 143 mmol/L (ref 134–144)
Total Protein: 7 g/dL (ref 6.0–8.5)

## 2017-01-18 LAB — MICROALBUMIN / CREATININE URINE RATIO: Creatinine, Urine: 80.3 mg/dL

## 2017-01-28 ENCOUNTER — Encounter: Payer: Self-pay | Admitting: Physician Assistant

## 2017-01-28 ENCOUNTER — Ambulatory Visit (INDEPENDENT_AMBULATORY_CARE_PROVIDER_SITE_OTHER): Payer: BLUE CROSS/BLUE SHIELD | Admitting: Physician Assistant

## 2017-01-28 ENCOUNTER — Ambulatory Visit (INDEPENDENT_AMBULATORY_CARE_PROVIDER_SITE_OTHER): Payer: BLUE CROSS/BLUE SHIELD

## 2017-01-28 VITALS — BP 122/64 | HR 61 | Temp 97.6°F | Resp 16 | Ht 70.0 in | Wt 189.2 lb

## 2017-01-28 DIAGNOSIS — S6991XA Unspecified injury of right wrist, hand and finger(s), initial encounter: Secondary | ICD-10-CM

## 2017-01-28 DIAGNOSIS — M545 Low back pain, unspecified: Secondary | ICD-10-CM

## 2017-01-28 DIAGNOSIS — R109 Unspecified abdominal pain: Secondary | ICD-10-CM

## 2017-01-28 DIAGNOSIS — S59912A Unspecified injury of left forearm, initial encounter: Secondary | ICD-10-CM

## 2017-01-28 LAB — POCT URINALYSIS DIP (MANUAL ENTRY)
BILIRUBIN UA: NEGATIVE mg/dL
Bilirubin, UA: NEGATIVE
Blood, UA: NEGATIVE
Glucose, UA: NEGATIVE mg/dL
LEUKOCYTES UA: NEGATIVE
Nitrite, UA: NEGATIVE
PROTEIN UA: NEGATIVE mg/dL
Spec Grav, UA: <=1.005 — AB (ref 1.010–1.025)
Urobilinogen, UA: 0.2 E.U./dL
pH, UA: 6.5 (ref 5.0–8.0)

## 2017-01-28 MED ORDER — CYCLOBENZAPRINE HCL 10 MG PO TABS: 5.0000 mg | ORAL_TABLET | Freq: Three times a day (TID) | ORAL | 0 refills | Status: DC | PRN
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
## 2017-01-28 NOTE — Progress Notes (Signed)
Verbal consent obtained. 4" orthoglass ulnar gutter splint applied to left forearm and wrapped with ace wrap. Distal CMS in tact.

## 2017-02-06 ENCOUNTER — Encounter: Payer: Self-pay | Admitting: Urgent Care

## 2017-02-06 ENCOUNTER — Ambulatory Visit (INDEPENDENT_AMBULATORY_CARE_PROVIDER_SITE_OTHER): Payer: BLUE CROSS/BLUE SHIELD | Admitting: Urgent Care

## 2017-02-06 ENCOUNTER — Ambulatory Visit (HOSPITAL_BASED_OUTPATIENT_CLINIC_OR_DEPARTMENT_OTHER)
Admission: RE | Admit: 2017-02-06 | Discharge: 2017-02-06 | Disposition: A | Discharge status: Home / Self Care | Payer: BLUE CROSS/BLUE SHIELD | Source: Ambulatory Visit | Attending: Urgent Care | Admitting: Urgent Care

## 2017-02-06 VITALS — BP 153/76 | HR 61 | Temp 97.8°F | Resp 18 | Ht 70.0 in | Wt 189.0 lb

## 2017-02-06 DIAGNOSIS — K5730 Diverticulosis of large intestine without perforation or abscess without bleeding: Secondary | ICD-10-CM | POA: Diagnosis not present

## 2017-02-06 DIAGNOSIS — M5137 Other intervertebral disc degeneration, lumbosacral region: Secondary | ICD-10-CM | POA: Insufficient documentation

## 2017-02-06 DIAGNOSIS — R58 Hemorrhage, not elsewhere classified: Secondary | ICD-10-CM

## 2017-02-06 DIAGNOSIS — M549 Dorsalgia, unspecified: Secondary | ICD-10-CM

## 2017-02-06 DIAGNOSIS — R109 Unspecified abdominal pain: Secondary | ICD-10-CM | POA: Insufficient documentation

## 2017-02-06 DIAGNOSIS — R1032 Left lower quadrant pain: Secondary | ICD-10-CM

## 2017-02-06 LAB — POCT URINALYSIS DIP (MANUAL ENTRY)
BILIRUBIN UA: NEGATIVE
BILIRUBIN UA: NEGATIVE mg/dL
GLUCOSE UA: NEGATIVE mg/dL
Leukocytes, UA: NEGATIVE
Nitrite, UA: NEGATIVE
Protein Ur, POC: NEGATIVE mg/dL
RBC UA: NEGATIVE
SPEC GRAV UA: 1.015 (ref 1.010–1.025)
UROBILINOGEN UA: 0.2 U/dL
pH, UA: 6 (ref 5.0–8.0)

## 2017-02-06 LAB — POCT CBC
Granulocyte percent: 63.8 %G (ref 37–80)
HCT, POC: 41.2 % — AB (ref 43.5–53.7)
HEMOGLOBIN: 14 g/dL — AB (ref 14.1–18.1)
LYMPH, POC: 1.8 (ref 0.6–3.4)
MCH: 28.9 pg (ref 27–31.2)
MCHC: 33.9 g/dL (ref 31.8–35.4)
MCV: 85.3 fL (ref 80–97)
MID (cbc): 0.4 (ref 0–0.9)
MPV: 7.5 fL (ref 0–99.8)
PLATELET COUNT, POC: 287 10*3/uL (ref 142–424)
POC Granulocyte: 3.9 (ref 2–6.9)
POC LYMPH PERCENT: 30 %L (ref 10–50)
POC MID %: 6.2 %M (ref 0–12)
RBC: 4.83 M/uL (ref 4.69–6.13)
RDW, POC: 13.5 %
WBC: 6.1 10*3/uL (ref 4.6–10.2)

## 2017-02-06 MED ORDER — IOPAMIDOL (ISOVUE-300) INJECTION 61%
100.0000 mL | Freq: Once | INTRAVENOUS | Status: AC | PRN
  Administered 2017-02-06: 100 mL via INTRAVENOUS
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
## 2017-02-06 NOTE — Patient Instructions (Addendum)
Flank Pain, Adult Flank pain is pain that is located on the side of the body between the upper abdomen and the back. This area is called the flank. The pain may occur over a short period of time (acute), or it may be long-term or recurring (chronic). It may be mild or severe. Flank pain can be caused by many things, including:  Muscle soreness or injury.  Kidney stones or kidney disease.  Stress.  A disease of the spine (vertebral disk disease).  A lung infection (pneumonia).  Fluid around the lungs (pulmonary edema).  A skin rash caused by the chickenpox virus (shingles).  Tumors that affect the back of the abdomen.  Gallbladder disease.  Follow these instructions at home:  Drink enough fluid to keep your urine clear or pale yellow.  Rest as told by your health care provider.  Take over-the-counter and prescription medicines only as told by your health care provider.  Keep a journal to track what has caused your flank pain and what has made it feel better.  Keep all follow-up visits as told by your health care provider. This is important. Contact a health care provider if:  Your pain is not controlled with medicine.  You have new symptoms.  Your pain gets worse.  You have a fever.  Your symptoms last longer than 2-3 days.  You have trouble urinating or you are urinating very frequently. Get help right away if:  You have trouble breathing or you are short of breath.  Your abdomen hurts or it is swollen or red.  You have nausea or vomiting.  You feel faint or you pass out.  You have blood in your urine. Summary  Flank pain is pain that is located on the side of the body between the upper abdomen and the back.  The pain may occur over a short period of time (acute), or it may be long-term or recurring (chronic). It may be mild or severe.  Flank pain can be caused by many things.  Contact your health care provider if your symptoms get worse or they last  longer than 2-3 days. This information is not intended to replace advice given to you by your health care provider. Make sure you discuss any questions you have with your health care provider. Document Released: 08/08/2005 Document Revised: 08/30/2016 Document Reviewed: 08/30/2016 Elsevier Interactive Patient Education  2018 [employer]     IF you received an x-ray today, you will receive an invoice from [facility]. Please contact [facility] at [phone] with questions or concerns regarding your invoice.   IF you received labwork today, you will receive an invoice from [facility]. Please contact [facility] at [phone] with questions or concerns regarding your invoice.   Our billing staff will not be able to assist you with questions regarding bills from these companies.  You will be contacted with the lab results as soon as they are available. The fastest way to get your results is to activate your My Chart account. Instructions are located on the last page of this paperwork. If you have not heard from us regarding the results in 2 weeks, please contact this office.

## 2017-02-07 ENCOUNTER — Telehealth: Payer: Self-pay | Admitting: Urgent Care

## 2017-02-07 LAB — COMPREHENSIVE METABOLIC PANEL
A/G RATIO: 1.8 (ref 1.2–2.2)
ALT: 21 IU/L (ref 0–44)
AST: 27 IU/L (ref 0–40)
Albumin: 4.5 g/dL (ref 3.6–4.8)
Alkaline Phosphatase: 72 IU/L (ref 39–117)
BUN/Creatinine Ratio: 14 (ref 10–24)
BUN: 14 mg/dL (ref 8–27)
Bilirubin Total: 0.3 mg/dL (ref 0.0–1.2)
CALCIUM: 9.6 mg/dL (ref 8.6–10.2)
CO2: 22 mmol/L (ref 20–29)
Chloride: 100 mmol/L (ref 96–106)
Creatinine, Ser: 0.98 mg/dL (ref 0.76–1.27)
GFR calc Af Amer: 95 mL/min/{1.73_m2} (ref >59)
GFR, EST NON AFRICAN AMERICAN: 82 mL/min/{1.73_m2} (ref >59)
Globulin, Total: 2.5 g/dL (ref 1.5–4.5)
Glucose: 99 mg/dL (ref 65–99)
POTASSIUM: 4.2 mmol/L (ref 3.5–5.2)
Sodium: 139 mmol/L (ref 134–144)
Total Protein: 7 g/dL (ref 6.0–8.5)
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
## 2017-02-08 NOTE — Telephone Encounter (Signed)
Please let me know if there is anything I need to do for this. Thank you!

## 2017-02-09 DIAGNOSIS — M62830 Muscle spasm of back: Secondary | ICD-10-CM | POA: Diagnosis not present

## 2017-02-09 DIAGNOSIS — I10 Essential (primary) hypertension: Secondary | ICD-10-CM | POA: Insufficient documentation

## 2017-02-09 DIAGNOSIS — Z7982 Long term (current) use of aspirin: Secondary | ICD-10-CM | POA: Diagnosis not present

## 2017-02-09 DIAGNOSIS — Z79899 Other long term (current) drug therapy: Secondary | ICD-10-CM | POA: Insufficient documentation

## 2017-02-09 DIAGNOSIS — M545 Low back pain: Secondary | ICD-10-CM | POA: Diagnosis present

## 2017-02-10 ENCOUNTER — Encounter (HOSPITAL_COMMUNITY): Payer: Self-pay

## 2017-02-10 ENCOUNTER — Emergency Department (HOSPITAL_COMMUNITY)
Admission: EM | Admit: 2017-02-10 | Discharge: 2017-02-10 | Disposition: A | Discharge status: Home / Self Care | Payer: BLUE CROSS/BLUE SHIELD | Attending: Emergency Medicine | Admitting: Emergency Medicine

## 2017-02-10 DIAGNOSIS — M62830 Muscle spasm of back: Secondary | ICD-10-CM

## 2017-02-10 MED ORDER — DIAZEPAM 5 MG PO TABS
5.0000 mg | ORAL_TABLET | Freq: Once | ORAL | Status: AC
  Administered 2017-02-10: 5 mg via ORAL
  Filled 2017-02-10: qty 1

## 2017-02-10 MED ORDER — DIAZEPAM 5 MG PO TABS: 5.0000 mg | ORAL_TABLET | Freq: Two times a day (BID) | ORAL | 0 refills | Status: DC

## 2017-02-10 MED ORDER — KETOROLAC TROMETHAMINE 30 MG/ML IJ SOLN
30.0000 mg | Freq: Once | INTRAMUSCULAR | Status: AC
  Administered 2017-02-10: 30 mg via INTRAMUSCULAR
  Filled 2017-02-10: qty 1

## 2017-02-10 NOTE — Discharge Instructions (Signed)
It is very important to stay hydrated to keep your muscles well lubricated.  I recommend that she stop the cyclobenzaprine (Flexeril) and start using the Valium on a regular basis as well as the meloxicam 7.5 mg 1 tablet twice a day. I also recommended gentle stretching, preferably in the hot tub that you have access to 2-3 times a day.  And other moderate exercise, avoid sudden jerking movements, lifting, pushing or pulling for the next several days.
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
## 2017-02-12 ENCOUNTER — Ambulatory Visit (INDEPENDENT_AMBULATORY_CARE_PROVIDER_SITE_OTHER): Payer: BLUE CROSS/BLUE SHIELD | Admitting: Urgent Care

## 2017-02-12 ENCOUNTER — Encounter: Payer: Self-pay | Admitting: Urgent Care

## 2017-02-12 VITALS — BP 153/77 | HR 58 | Temp 98.3°F | Resp 16 | Ht 71.0 in | Wt 191.6 lb

## 2017-02-12 DIAGNOSIS — I10 Essential (primary) hypertension: Secondary | ICD-10-CM | POA: Diagnosis not present

## 2017-02-12 DIAGNOSIS — R030 Elevated blood-pressure reading, without diagnosis of hypertension: Secondary | ICD-10-CM | POA: Diagnosis not present

## 2017-02-12 DIAGNOSIS — M51369 Other intervertebral disc degeneration, lumbar region without mention of lumbar back pain or lower extremity pain: Secondary | ICD-10-CM | POA: Insufficient documentation

## 2017-02-12 DIAGNOSIS — M5136 Other intervertebral disc degeneration, lumbar region: Secondary | ICD-10-CM

## 2017-02-12 DIAGNOSIS — K5790 Diverticulosis of intestine, part unspecified, without perforation or abscess without bleeding: Secondary | ICD-10-CM | POA: Diagnosis not present

## 2017-02-12 DIAGNOSIS — M545 Low back pain, unspecified: Secondary | ICD-10-CM

## 2017-02-12 DIAGNOSIS — R109 Unspecified abdominal pain: Secondary | ICD-10-CM

## 2017-02-12 HISTORY — DX: Other intervertebral disc degeneration, lumbar region without mention of lumbar back pain or lower extremity pain: M51.369

## 2017-02-12 HISTORY — DX: Low back pain, unspecified: M54.50

## 2017-02-12 HISTORY — DX: Other intervertebral disc degeneration, lumbar region: M51.36

## 2017-02-12 MED ORDER — PREDNISONE 20 MG PO TABS: 40.0000 mg | ORAL_TABLET | Freq: Every day | ORAL | 0 refills | Status: DC

## 2017-02-12 NOTE — Patient Instructions (Addendum)
Do not use meloxicam for now. Use prednisone with Flexeril to address your severe back pain. Hot/cold patches for the back may also help. Make sure you rest your back by not doing strenuous work activities. Try to get some walking in throughout the day. If you develop fever, left-sided belly pain, bloody stools, nausea, vomiting, please return to our clinic for a recheck. Make sure you recheck your blood pressure at night after 5-10 minutes of rest in a sitting position.     IF you received an x-ray today, you will receive an invoice from [facility]. Please contact [facility] at [phone] with questions or concerns regarding your invoice.   IF you received labwork today, you will receive an invoice from [facility]. Please contact [facility] at [phone] with questions or concerns regarding your invoice.   Our billing staff will not be able to assist you with questions regarding bills from these companies.  You will be contacted with the lab results as soon as they are available. The fastest way to get your results is to activate your My Chart account. Instructions are located on the last page of this paperwork. If you have not heard from us regarding the results in 2 weeks, please contact this office.

## 2017-02-12 NOTE — Telephone Encounter (Signed)
[facility] faxed our office stating the CT Abdomen and Pelvis has not been approved. A physician review can be done by calling [phone]. If unsatisfied with this decision, a provider courtesy review can be started within 180 days of receiving this fax on 02/11/17 by calling the above number and dialing extension [phone]. An appeal can also be initiated. More information on this can be found in the fax, and I will place this in the provider's box. Thanks.

## 2017-02-12 NOTE — Progress Notes (Signed)
MRN: [id number] DOB: [birth year]  Subjective:   Brendan Wagner is a 61 y.o. male presenting for follow up on left flank, abdominal pain. Last OV was 02/06/2017, CT was negative for an acute process. It did demonstrate degenerative changes at L4-S1, diverticulosis without signs of diverticulitis. He presented to the ER on 02/10/2017, was given Valium without relief. He did experience relief with IM Toradol. Was d/c'ed and was referred back to our clinic. Today, he reports mid low back pain that radiates to his left side and occasionally his right side. Denies fever, chest pain, shob, abdominal pain, dysuria, hematuria, weakness, numbness or tingling, radiculopathy, trauma. He has not been able to report his normal work activities due to his pain. Denies smoking cigarettes.   Brendan Wagner has a current medication list which includes the following prescription(s): amlodipine, aspirin ec, diazepam, lisinopril-hydrochlorothiazide, meloxicam, fish oil, and cyclobenzaprine. Also has No Known Allergies.  Brendan Wagner  has a past medical history of Hypertension. Also  has a past surgical history that includes Appendectomy (1973); Shoulder surgery (2008); Fracture surgery (Left); and Vasectomy.  Objective:   Vitals: BP (!) 153/77 (BP Location: Right Arm, Patient Position: Sitting, Cuff Size: Normal)   Pulse (!) 58   Temp 98.3 F (36.8 C) (Oral)   Resp 16   Ht 5\' 11"  (1.803 m)   Wt 191 lb 9.6 oz (86.9 kg)   SpO2 98%   BMI 26.72 kg/m   BP Readings from Last 3 Encounters:  02/12/17 (!) 153/77  02/10/17 136/88  02/06/17 (!) 153/76    Physical Exam  Constitutional: He is oriented to person, place, and time. He appears well-developed and well-nourished.  HENT:  Mouth/Throat: Oropharynx is clear and moist.  Eyes: No scleral icterus.  Cardiovascular: Normal rate, regular rhythm and intact distal pulses.  Exam reveals no gallop and no friction rub.   No murmur heard. Pulmonary/Chest: No respiratory distress. He  has no wheezes. He has no rales.  Abdominal: Soft. Bowel sounds are normal. He exhibits no distension and no mass. There is no tenderness. There is no guarding.  Musculoskeletal:       Thoracic back: He exhibits normal range of motion, no tenderness, no bony tenderness, no swelling, no edema, no deformity, no laceration and no spasm.       Lumbar back: He exhibits decreased range of motion (flexion, extension, rotation) and tenderness. He exhibits no bony tenderness, no swelling, no edema and no deformity.       Back:  Negative SLR.  Neurological: He is alert and oriented to person, place, and time. He displays normal reflexes.  Skin: Skin is warm and dry.   Assessment and Plan :   1. Acute bilateral low back pain without sciatica 2. Degenerative disc disease, lumbar - Will manage as severe musculoskeletal type pain. Start short steroid course. Stop Valium. Use Flexeril for muscle relaxing properties. Use hot/cold patches, rest back from strenuous work activities. We will plan to use PT for his back pain depending on his progress with prednisone.  3. Left lateral abdominal pain 4. Left flank pain - CT findings were reassuring, physical exam findings are consistent with musculoskeletal source for his pain. Return-to-clinic precautions discussed, patient verbalized understanding.   5. Diverticulosis of intestine without bleeding, unspecified intestinal tract location - Counseled on diagnosis. Return-to-clinic precautions discussed, patient verbalized understanding.   6. Essential hypertension 7. Elevated blood pressure reading - Patient will monitor his BP over the next week.   Wallis BambergMario Beulah Capobianco, PA-C [facility] [phone] 02/12/2017 9:19 AM

## 2017-02-13 ENCOUNTER — Ambulatory Visit: Payer: BLUE CROSS/BLUE SHIELD | Admitting: Urgent Care

## 2017-02-13 NOTE — Telephone Encounter (Signed)
I was advised that we need to start an appeal process since the CT has already been performed. I left a message with the patient's name, dob as well as my name, our clinic information. Let me know what they need from me to take care of this. Thank you!

## 2017-02-14 NOTE — Telephone Encounter (Signed)
Received call from [employer] stating that since this is a retrospective authorization request, they are unable to do these and the member's health plan would need to be directly contacted. The provider service number for pt's health plan is [phone]. If prior auth/certification number is needed, that is [id number]. Let me know if I can help. Thanks!

## 2017-02-14 NOTE — Telephone Encounter (Signed)
See below

## 2017-02-17 ENCOUNTER — Other Ambulatory Visit: Payer: Self-pay | Admitting: Urgent Care

## 2017-02-17 NOTE — Telephone Encounter (Signed)
Spoke with [employer] representative. Retrospective image requires additional information for processing.  Forms faxed for completion. Will fax after completion.

## 2017-02-17 NOTE — Telephone Encounter (Signed)
Wanted to forward this your way. Let me know if you need any other info from me. Thanks, Pattricia Boss!

## 2017-02-18 NOTE — Telephone Encounter (Signed)
Level one provider appeal form filled out by Brendan Wagner was faxed to [facility] on 8/21. Their fax number is [phone]. I have received confirmation that this fax went through and will keep this form for referral records. Thanks.

## 2017-02-20 ENCOUNTER — Ambulatory Visit (INDEPENDENT_AMBULATORY_CARE_PROVIDER_SITE_OTHER): Payer: BLUE CROSS/BLUE SHIELD | Admitting: Urgent Care

## 2017-02-20 ENCOUNTER — Encounter: Payer: Self-pay | Admitting: Urgent Care

## 2017-02-20 VITALS — BP 139/76 | HR 64 | Temp 97.8°F | Resp 16 | Ht 70.47 in | Wt 189.0 lb

## 2017-02-20 DIAGNOSIS — I10 Essential (primary) hypertension: Secondary | ICD-10-CM

## 2017-02-20 DIAGNOSIS — R109 Unspecified abdominal pain: Secondary | ICD-10-CM | POA: Insufficient documentation

## 2017-02-20 DIAGNOSIS — R030 Elevated blood-pressure reading, without diagnosis of hypertension: Secondary | ICD-10-CM | POA: Diagnosis not present

## 2017-02-20 DIAGNOSIS — M5136 Other intervertebral disc degeneration, lumbar region: Secondary | ICD-10-CM | POA: Diagnosis not present

## 2017-02-20 DIAGNOSIS — D649 Anemia, unspecified: Secondary | ICD-10-CM

## 2017-02-20 HISTORY — DX: Unspecified abdominal pain: R10.9

## 2017-02-20 MED ORDER — METHOCARBAMOL 500 MG PO TABS: 500.0000 mg | ORAL_TABLET | Freq: Every evening | ORAL | 1 refills | Status: DC | PRN
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
## 2017-02-20 NOTE — Patient Instructions (Addendum)
Abdominal Pain, Adult Abdominal pain can be caused by many things. Often, abdominal pain is not serious and it gets better with no treatment or by being treated at home. However, sometimes abdominal pain is serious. Your health care provider will do a medical history and a physical exam to try to determine the cause of your abdominal pain. Follow these instructions at home:  Take over-the-counter and prescription medicines only as told by your health care provider. Do not take a laxative unless told by your health care provider.  Drink enough fluid to keep your urine clear or pale yellow.  Watch your condition for any changes.  Keep all follow-up visits as told by your health care provider. This is important. Contact a health care provider if:  Your abdominal pain changes or gets worse.  You are not hungry or you lose weight without trying.  You are constipated or have diarrhea for more than 2-3 days.  You have pain when you urinate or have a bowel movement.  Your abdominal pain wakes you up at night.  Your pain gets worse with meals, after eating, or with certain foods.  You are throwing up and cannot keep anything down.  You have a fever. Get help right away if:  Your pain does not go away as soon as your health care provider told you to expect.  You cannot stop throwing up.  Your pain is only in areas of the abdomen, such as the right side or the left lower portion of the abdomen.  You have bloody or black stools, or stools that look like tar.  You have severe pain, cramping, or bloating in your abdomen.  You have signs of dehydration, such as: ? Dark urine, very little urine, or no urine. ? Cracked lips. ? Dry mouth. ? Sunken eyes. ? Sleepiness. ? Weakness. This information is not intended to replace advice given to you by your health care provider. Make sure you discuss any questions you have with your health care provider. Document Released: 03/27/2005 Document  Revised: 01/05/2016 Document Reviewed: 11/29/2015 Elsevier Interactive Patient Education  2017 [employer]     IF you received an x-ray today, you will receive an invoice from [facility]. Please contact [facility] at [phone] with questions or concerns regarding your invoice.   IF you received labwork today, you will receive an invoice from [facility]. Please contact [facility] at [phone] with questions or concerns regarding your invoice.   Our billing staff will not be able to assist you with questions regarding bills from these companies.  You will be contacted with the lab results as soon as they are available. The fastest way to get your results is to activate your My Chart account. Instructions are located on the last page of this paperwork. If you have not heard from us regarding the results in 2 weeks, please contact this office.      

## 2017-02-24 LAB — PATHOLOGIST SMEAR REVIEW
Basophils Absolute: 0.1 10*3/uL (ref 0.0–0.2)
Basos: 1 %
EOS (ABSOLUTE): 0.1 10*3/uL (ref 0.0–0.4)
Eos: 2 %
HEMATOCRIT: 43.2 % (ref 37.5–51.0)
HEMOGLOBIN: 14 g/dL (ref 13.0–17.7)
IMMATURE GRANS (ABS): 0 10*3/uL (ref 0.0–0.1)
Immature Granulocytes: 0 %
LYMPHS ABS: 1.4 10*3/uL (ref 0.7–3.1)
LYMPHS: 20 %
MCH: 29.3 pg (ref 26.6–33.0)
MCHC: 32.4 g/dL (ref 31.5–35.7)
MCV: 90 fL (ref 79–97)
MONOS ABS: 0.7 10*3/uL (ref 0.1–0.9)
Monocytes: 10 %
NEUTROS PCT: 67 %
Neutrophils Absolute: 4.7 10*3/uL (ref 1.4–7.0)
Platelets: 308 10*3/uL (ref 150–379)
RBC: 4.78 x10E6/uL (ref 4.14–5.80)
RDW: 14.1 % (ref 12.3–15.4)
WBC: 7 10*3/uL (ref 3.4–10.8)

## 2017-02-24 LAB — FERRITIN: Ferritin: 108 ng/mL (ref 30–400)

## 2017-03-08 ENCOUNTER — Other Ambulatory Visit: Payer: Self-pay | Admitting: Urgent Care

## 2017-03-20 ENCOUNTER — Encounter: Payer: Self-pay | Admitting: Urgent Care

## 2017-03-20 ENCOUNTER — Ambulatory Visit (INDEPENDENT_AMBULATORY_CARE_PROVIDER_SITE_OTHER): Payer: BLUE CROSS/BLUE SHIELD | Admitting: Urgent Care

## 2017-03-20 VITALS — BP 116/71 | HR 66 | Temp 97.7°F | Resp 16 | Ht 70.0 in | Wt 185.0 lb

## 2017-03-20 DIAGNOSIS — I10 Essential (primary) hypertension: Secondary | ICD-10-CM | POA: Diagnosis not present

## 2017-03-20 DIAGNOSIS — Z23 Encounter for immunization: Secondary | ICD-10-CM

## 2017-03-20 DIAGNOSIS — M5136 Other intervertebral disc degeneration, lumbar region: Secondary | ICD-10-CM | POA: Diagnosis not present

## 2017-03-20 DIAGNOSIS — R109 Unspecified abdominal pain: Secondary | ICD-10-CM | POA: Diagnosis not present

## 2017-03-20 NOTE — Patient Instructions (Addendum)
Hypertension Hypertension, commonly called high blood pressure, is when the force of blood pumping through the arteries is too strong. The arteries are the blood vessels that carry blood from the heart throughout the body. Hypertension forces the heart to work harder to pump blood and may cause arteries to become narrow or stiff. Having untreated or uncontrolled hypertension can cause heart attacks, strokes, kidney disease, and other problems. A blood pressure reading consists of a higher number over a lower number. Ideally, your blood pressure should be below 120/80. The first ("top") number is called the systolic pressure. It is a measure of the pressure in your arteries as your heart beats. The second ("bottom") number is called the diastolic pressure. It is a measure of the pressure in your arteries as the heart relaxes. What are the causes? The cause of this condition is not known. What increases the risk? Some risk factors for high blood pressure are under your control. Others are not. Factors you can change  Smoking.  Having type 2 diabetes mellitus, high cholesterol, or both.  Not getting enough exercise or physical activity.  Being overweight.  Having too much fat, sugar, calories, or salt (sodium) in your diet.  Drinking too much alcohol. Factors that are difficult or impossible to change  Having chronic kidney disease.  Having a family history of high blood pressure.  Age. Risk increases with age.  Race. You may be at higher risk if you are African-American.  Gender. Men are at higher risk than women before age 45. After age 65, women are at higher risk than men.  Having obstructive sleep apnea.  Stress. What are the signs or symptoms? Extremely high blood pressure (hypertensive crisis) may cause:  Headache.  Anxiety.  Shortness of breath.  Nosebleed.  Nausea and vomiting.  Severe chest pain.  Jerky movements you cannot control (seizures).  How is this  diagnosed? This condition is diagnosed by measuring your blood pressure while you are seated, with your arm resting on a surface. The cuff of the blood pressure monitor will be placed directly against the skin of your upper arm at the level of your heart. It should be measured at least twice using the same arm. Certain conditions can cause a difference in blood pressure between your right and left arms. Certain factors can cause blood pressure readings to be lower or higher than normal (elevated) for a short period of time:  When your blood pressure is higher when you are in a health care provider's office than when you are at home, this is called white coat hypertension. Most people with this condition do not need medicines.  When your blood pressure is higher at home than when you are in a health care provider's office, this is called masked hypertension. Most people with this condition may need medicines to control blood pressure.  If you have a high blood pressure reading during one visit or you have normal blood pressure with other risk factors:  You may be asked to return on a different day to have your blood pressure checked again.  You may be asked to monitor your blood pressure at home for 1 week or longer.  If you are diagnosed with hypertension, you may have other blood or imaging tests to help your health care provider understand your overall risk for other conditions. How is this treated? This condition is treated by making healthy lifestyle changes, such as eating healthy foods, exercising more, and reducing your alcohol intake. Your   health care provider may prescribe medicine if lifestyle changes are not enough to get your blood pressure under control, and if:  Your systolic blood pressure is above 130.  Your diastolic blood pressure is above 80.  Your personal target blood pressure may vary depending on your medical conditions, your age, and other factors. Follow these  instructions at home: Eating and drinking  Eat a diet that is high in fiber and potassium, and low in sodium, added sugar, and fat. An example eating plan is called the DASH (Dietary Approaches to Stop Hypertension) diet. To eat this way: ? Eat plenty of fresh fruits and vegetables. Try to fill half of your plate at each meal with fruits and vegetables. ? Eat whole grains, such as whole wheat pasta, brown rice, or whole grain bread. Fill about one quarter of your plate with whole grains. ? Eat or drink low-fat dairy products, such as skim milk or low-fat yogurt. ? Avoid fatty cuts of meat, processed or cured meats, and poultry with skin. Fill about one quarter of your plate with lean proteins, such as fish, chicken without skin, beans, eggs, and tofu. ? Avoid premade and processed foods. These tend to be higher in sodium, added sugar, and fat.  Reduce your daily sodium intake. Most people with hypertension should eat less than 1,500 mg of sodium a day.  Limit alcohol intake to no more than 1 drink a day for nonpregnant women and 2 drinks a day for men. One drink equals 12 oz of beer, 5 oz of wine, or 1 oz of hard liquor. Lifestyle  Work with your health care provider to maintain a healthy body weight or to lose weight. Ask what an ideal weight is for you.  Get at least 30 minutes of exercise that causes your heart to beat faster (aerobic exercise) most days of the week. Activities may include walking, swimming, or biking.  Include exercise to strengthen your muscles (resistance exercise), such as pilates or lifting weights, as part of your weekly exercise routine. Try to do these types of exercises for 30 minutes at least 3 days a week.  Do not use any products that contain nicotine or tobacco, such as cigarettes and e-cigarettes. If you need help quitting, ask your health care provider.  Monitor your blood pressure at home as told by your health care provider.  Keep all follow-up visits as  told by your health care provider. This is important. Medicines  Take over-the-counter and prescription medicines only as told by your health care provider. Follow directions carefully. Blood pressure medicines must be taken as prescribed.  Do not skip doses of blood pressure medicine. Doing this puts you at risk for problems and can make the medicine less effective.  Ask your health care provider about side effects or reactions to medicines that you should watch for. Contact a health care provider if:  You think you are having a reaction to a medicine you are taking.  You have headaches that keep coming back (recurring).  You feel dizzy.  You have swelling in your ankles.  You have trouble with your vision. Get help right away if:  You develop a severe headache or confusion.  You have unusual weakness or numbness.  You feel faint.  You have severe pain in your chest or abdomen.  You vomit repeatedly.  You have trouble breathing. Summary  Hypertension is when the force of blood pumping through your arteries is too strong. If this condition is not   controlled, it may put you at risk for serious complications.  Your personal target blood pressure may vary depending on your medical conditions, your age, and other factors. For most people, a normal blood pressure is less than 120/80.  Hypertension is treated with lifestyle changes, medicines, or a combination of both. Lifestyle changes include weight loss, eating a healthy, low-sodium diet, exercising more, and limiting alcohol. This information is not intended to replace advice given to you by your health care provider. Make sure you discuss any questions you have with your health care provider. Document Released: 06/17/2005 Document Revised: 05/15/2016 Document Reviewed: 05/15/2016 Elsevier Interactive Patient Education  2018 [employer]     IF you received an x-ray today, you will receive an invoice from [facility]. Please contact  Radiology at [phone] with questions or concerns regarding your invoice.   IF you received labwork today, you will receive an invoice from [facility]. Please contact [facility] at [phone] with questions or concerns regarding your invoice.   Our billing staff will not be able to assist you with questions regarding bills from these companies.  You will be contacted with the lab results as soon as they are available. The fastest way to get your results is to activate your My Chart account. Instructions are located on the last page of this paperwork. If you have not heard from us regarding the results in 2 weeks, please contact this office.     

## 2017-03-20 NOTE — Progress Notes (Signed)
   MRN: [id number] DOB: 02-09-1956  Subjective:   Brendan Wagner is a 61 y.o. male presenting for follow up on Hypertension.   Currently managed with lis-HCTZ. Patient is checking blood pressure at home. Avoids salt in diet, is staying active. Reports that his low back pain, flank pain is so much better. Denies dizziness, chronic headache, blurred vision, chest pain, shortness of breath, heart racing, palpitations, nausea, vomiting, abdominal pain, hematuria, lower leg swelling. Denies smoking cigarettes or drinking alcohol.   Brendan Wagner has a current medication list which includes the following prescription(s): amlodipine, aspirin ec, lisinopril-hydrochlorothiazide, and fish oil. Also has No Known Allergies.  Brendan Wagner  has a past medical history of Hypertension. Also  has a past surgical history that includes Appendectomy (1973); Shoulder surgery (2008); Fracture surgery (Left); and Vasectomy.  Objective:   Vitals: BP 116/71   Pulse 66   Temp 97.7 F (36.5 C) (Oral)   Resp 16   Ht  (1.778 m)   Wt 185 lb (83.9 kg)   SpO2 100%   BMI 26.54 kg/m   BP Readings from Last 3 Encounters:  03/20/17 116/71  02/20/17 139/76  02/12/17 (!) 153/77   Physical Exam  Constitutional: He is oriented to person, place, and time. He appears well-developed and well-nourished.  Eyes: No scleral icterus.  Cardiovascular: Normal rate, regular rhythm and intact distal pulses.  Exam reveals no gallop and no friction rub.   No murmur heard. Pulmonary/Chest: No respiratory distress. He has no wheezes. He has no rales.  Neurological: He is alert and oriented to person, place, and time.  Psychiatric: He has a normal mood and affect.   Assessment and Plan :   1. Essential hypertension - Well controlled, okay to refill up to 1 year. Follow up in 6 months.  2. Need for influenza vaccination - Flu Vaccine QUAD 36+ mos IM  3. Left flank pain 4. Left lateral abdominal pain 5. Degenerative disc disease,  lumbar - Significantly improved. Emphasized back care. Follow up as needed.  Wallis Bamberg, PA-C Primary Care at [facility] [phone] 03/20/2017  8:50 AM

## 2017-03-27 ENCOUNTER — Other Ambulatory Visit: Payer: Self-pay | Admitting: Urgent Care

## 2017-05-10 ENCOUNTER — Other Ambulatory Visit: Payer: Self-pay | Admitting: Urgent Care

## 2017-05-12 ENCOUNTER — Telehealth: Payer: Self-pay | Admitting: Urgent Care

## 2017-05-12 NOTE — Telephone Encounter (Signed)
Please review

## 2017-05-12 NOTE — Telephone Encounter (Signed)
Copied from CRM #[id number]. Topic: Quick Communication - See Telephone Encounter >> May 12, 2017 10:35 AM Guinevere FerrariMorris, Adeyemi Hamad E, NT wrote: CRM for notification. See Telephone encounter for:  Raven from [facility] called about a refill on medication. Medication is Meloxicam 7.5 MG. [address] is [address] 05/12/17.

## 2017-05-13 NOTE — Telephone Encounter (Signed)
Left message patient needs appointment

## 2017-05-20 LAB — HM COLONOSCOPY

## 2017-07-09 ENCOUNTER — Other Ambulatory Visit: Payer: Self-pay | Admitting: Urgent Care

## 2017-07-28 ENCOUNTER — Telehealth: Payer: Self-pay | Admitting: Urgent Care

## 2017-07-28 NOTE — Telephone Encounter (Signed)
Request for Tamiflu because brother-in-law that lives with them just diagnosed with the flu.  [facility] [address]   He prefers the generic.   See attached documentation.  Sees [facility] MaxinMike Mani.

## 2017-07-28 NOTE — Telephone Encounter (Signed)
Copied from CRM [phone]. Topic: Quick Communication - See Telephone Encounter >> Jul 28, 2017  2:39 PM Arlyss Gandyichardson, Husayn Reim N, NT wrote: CRM for notification. See Telephone encounter for:  Pt calling and states his brother-in-law that lives with them was diagnosed with the flu today and his pcp told him and his wife to call their pcp to have Tamiflu called in for them. [facility] [address] He would prefer the generic  07/28/17.

## 2017-07-28 NOTE — Telephone Encounter (Signed)
Please advise 

## 2017-07-29 ENCOUNTER — Other Ambulatory Visit: Payer: Self-pay | Admitting: *Deleted

## 2017-07-29 MED ORDER — OSELTAMIVIR PHOSPHATE 75 MG PO CAPS: 75.0000 mg | ORAL_CAPSULE | Freq: Every day | ORAL | 0 refills | Status: DC

## 2017-07-29 NOTE — Progress Notes (Signed)
Per Dr. Clelia CroftShaw patient was given Oseltamivir 75 mg with 14 capsules with no refills.    [facility] [address].

## 2017-07-29 NOTE — Telephone Encounter (Signed)
Appears prescribed by shaw. Please make sure patient is aware.  14 days daily

## 2017-07-30 NOTE — Telephone Encounter (Signed)
Pt received rx and is aware.

## 2017-08-08 ENCOUNTER — Other Ambulatory Visit: Payer: Self-pay | Admitting: Urgent Care

## 2017-08-29 ENCOUNTER — Other Ambulatory Visit: Payer: Self-pay | Admitting: Urgent Care

## 2017-08-29 DIAGNOSIS — I10 Essential (primary) hypertension: Secondary | ICD-10-CM

## 2017-09-29 ENCOUNTER — Encounter: Payer: Self-pay | Admitting: Physician Assistant

## 2017-10-08 ENCOUNTER — Ambulatory Visit: Payer: BLUE CROSS/BLUE SHIELD | Admitting: Family Medicine

## 2017-10-08 ENCOUNTER — Ambulatory Visit (INDEPENDENT_AMBULATORY_CARE_PROVIDER_SITE_OTHER): Payer: BLUE CROSS/BLUE SHIELD

## 2017-10-08 ENCOUNTER — Other Ambulatory Visit: Payer: Self-pay

## 2017-10-08 ENCOUNTER — Encounter: Payer: Self-pay | Admitting: Family Medicine

## 2017-10-08 VITALS — BP 122/68 | HR 79 | Temp 97.6°F | Ht 70.0 in | Wt 187.2 lb

## 2017-10-08 DIAGNOSIS — R05 Cough: Secondary | ICD-10-CM | POA: Diagnosis not present

## 2017-10-08 DIAGNOSIS — J301 Allergic rhinitis due to pollen: Secondary | ICD-10-CM

## 2017-10-08 DIAGNOSIS — H1013 Acute atopic conjunctivitis, bilateral: Secondary | ICD-10-CM | POA: Diagnosis not present

## 2017-10-08 DIAGNOSIS — R059 Cough, unspecified: Secondary | ICD-10-CM

## 2017-10-08 MED ORDER — AZITHROMYCIN 250 MG PO TABS: ORAL_TABLET | ORAL | 0 refills | Status: DC

## 2017-10-08 MED ORDER — PREDNISONE 20 MG PO TABS: ORAL_TABLET | ORAL | 0 refills | Status: DC

## 2017-10-08 NOTE — Patient Instructions (Addendum)
Recommend CLARITIN/LORATADINE 10MG  ONE TABLET DAILY. If necessary, also take FLONASE 2 SPRAYS INTO EACH NOSTRIL DAILY. Recommend allergy eye drop (Naphcon A, Opcan A).  IF you received an x-ray today, you will receive an invoice from [facility]. Please contact [facility] at [phone] with questions or concerns regarding your invoice.   IF you received labwork today, you will receive an invoice from [facility]. Please contact [facility] at [phone] with questions or concerns regarding your invoice.   Our billing staff will not be able to assist you with questions regarding bills from these companies.  You will be contacted with the lab results as soon as they are available. The fastest way to get your results is to activate your My Chart account. Instructions are located on the last page of this paperwork. If you have not heard from us regarding the results in 2 weeks, please contact this office.    Allergic Rhinitis, Adult Allergic rhinitis is an allergic reaction that affects the mucous membrane inside the nose. It causes sneezing, a runny or stuffy nose, and the feeling of mucus going down the back of the throat (postnasal drip). Allergic rhinitis can be mild to severe. There are two types of allergic rhinitis:  Seasonal. This type is also called hay fever. It happens only during certain seasons.  Perennial. This type can happen at any time of the year.  What are the causes? This condition happens when the body's defense system (immune system) responds to certain harmless substances called allergens as though they were germs.  Seasonal allergic rhinitis is triggered by pollen, which can come from grasses, trees, and weeds. Perennial allergic rhinitis may be caused by:  House dust mites.  Pet dander.  Mold spores.  What are the signs or symptoms? Symptoms of this condition include:  Sneezing.  Runny or stuffy nose (nasal congestion).  Postnasal  drip.  Itchy nose.  Tearing of the eyes.  Trouble sleeping.  Daytime sleepiness.  How is this diagnosed? This condition may be diagnosed based on:  Your medical history.  A physical exam.  Tests to check for related conditions, such as: ? Asthma. ? Pink eye. ? Ear infection. ? Upper respiratory infection.  Tests to find out which allergens trigger your symptoms. These may include skin or blood tests.  How is this treated? There is no cure for this condition, but treatment can help control symptoms. Treatment may include:  Taking medicines that block allergy symptoms, such as antihistamines. Medicine may be given as a shot, nasal spray, or pill.  Avoiding the allergen.  Desensitization. This treatment involves getting ongoing shots until your body becomes less sensitive to the allergen. This treatment may be done if other treatments do not help.  If taking medicine and avoiding the allergen does not work, new, stronger medicines may be prescribed.  Follow these instructions at home:  Find out what you are allergic to. Common allergens include smoke, dust, and pollen.  Avoid the things you are allergic to. These are some things you can do to help avoid allergens: ? Replace carpet with wood, tile, or vinyl flooring. Carpet can trap dander and dust. ? Do not smoke. Do not allow smoking in your home. ? Change your heating and air conditioning filter at least once a month. ? During allergy season:  Keep windows closed as much as possible.  Plan outdoor activities when pollen counts are lowest. This is usually during the evening hours.  When coming indoors, change clothing and  shower before sitting on furniture or bedding.  Take over-the-counter and prescription medicines only as told by your health care provider.  Keep all follow-up visits as told by your health care provider. This is important. Contact a health care provider if:  You have a fever.  You develop a  persistent cough.  You make whistling sounds when you breathe (you wheeze).  Your symptoms interfere with your normal daily activities. Get help right away if:  You have shortness of breath. Summary  This condition can be managed by taking medicines as directed and avoiding allergens.  Contact your health care provider if you develop a persistent cough or fever.  During allergy season, keep windows closed as much as possible. This information is not intended to replace advice given to you by your health care provider. Make sure you discuss any questions you have with your health care provider. Document Released: 03/12/2001 Document Revised: 07/25/2016 Document Reviewed: 07/25/2016 Elsevier Interactive Patient Education  [employer].
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
## 2017-11-03 ENCOUNTER — Other Ambulatory Visit: Payer: Self-pay | Admitting: Urgent Care

## 2017-11-03 DIAGNOSIS — I10 Essential (primary) hypertension: Secondary | ICD-10-CM

## 2017-11-04 ENCOUNTER — Other Ambulatory Visit: Payer: Self-pay | Admitting: Urgent Care

## 2017-11-07 ENCOUNTER — Telehealth: Payer: Self-pay

## 2017-11-07 NOTE — Telephone Encounter (Signed)
OV needed.   Copied from CRM [id number]. Topic: General - Other >> Nov 07, 2017  2:32 PM Gerrianne Scale wrote: Reason for CRM: patient calling stating that Urban Gibson had given him a Rx for Meloxicam 7.5mg  for his back pain patient  would like another refill

## 2017-11-24 ENCOUNTER — Other Ambulatory Visit: Payer: Self-pay | Admitting: Urgent Care

## 2017-11-24 DIAGNOSIS — I10 Essential (primary) hypertension: Secondary | ICD-10-CM

## 2017-11-25 ENCOUNTER — Encounter: Payer: Self-pay | Admitting: Family Medicine

## 2017-12-17 ENCOUNTER — Other Ambulatory Visit: Payer: Self-pay | Admitting: Urgent Care

## 2017-12-17 DIAGNOSIS — I10 Essential (primary) hypertension: Secondary | ICD-10-CM

## 2017-12-21 DIAGNOSIS — J301 Allergic rhinitis due to pollen: Secondary | ICD-10-CM

## 2017-12-21 DIAGNOSIS — H1013 Acute atopic conjunctivitis, bilateral: Secondary | ICD-10-CM | POA: Insufficient documentation

## 2017-12-21 HISTORY — DX: Allergic rhinitis due to pollen: J30.1

## 2017-12-21 HISTORY — DX: Acute atopic conjunctivitis, bilateral: H10.13

## 2017-12-30 ENCOUNTER — Ambulatory Visit: Payer: BLUE CROSS/BLUE SHIELD | Admitting: Urgent Care

## 2017-12-30 ENCOUNTER — Encounter: Payer: Self-pay | Admitting: Urgent Care

## 2017-12-30 ENCOUNTER — Other Ambulatory Visit: Payer: Self-pay

## 2017-12-30 VITALS — BP 127/72 | HR 62 | Temp 98.1°F | Ht 71.0 in | Wt 186.8 lb

## 2017-12-30 DIAGNOSIS — I10 Essential (primary) hypertension: Secondary | ICD-10-CM

## 2017-12-30 DIAGNOSIS — K5730 Diverticulosis of large intestine without perforation or abscess without bleeding: Secondary | ICD-10-CM

## 2017-12-30 DIAGNOSIS — E782 Mixed hyperlipidemia: Secondary | ICD-10-CM | POA: Diagnosis not present

## 2017-12-30 MED ORDER — AMLODIPINE BESYLATE 5 MG PO TABS: 5.0000 mg | ORAL_TABLET | Freq: Every day | ORAL | 3 refills | Status: AC

## 2017-12-30 MED ORDER — LISINOPRIL-HYDROCHLOROTHIAZIDE 20-12.5 MG PO TABS: 1.0000 | ORAL_TABLET | Freq: Every day | ORAL | 3 refills | Status: AC

## 2017-12-30 NOTE — Addendum Note (Signed)
Addended by: Wallis BambergMANI, Darnell Stimson on: 12/30/2017 04:25 PM   Modules accepted: Level of Service

## 2017-12-30 NOTE — Patient Instructions (Addendum)
Hypertension Hypertension, commonly called high blood pressure, is when the force of blood pumping through the arteries is too strong. The arteries are the blood vessels that carry blood from the heart throughout the body. Hypertension forces the heart to work harder to pump blood and may cause arteries to become narrow or stiff. Having untreated or uncontrolled hypertension can cause heart attacks, strokes, kidney disease, and other problems. A blood pressure reading consists of a higher number over a lower number. Ideally, your blood pressure should be below 120/80. The first ("top") number is called the systolic pressure. It is a measure of the pressure in your arteries as your heart beats. The second ("bottom") number is called the diastolic pressure. It is a measure of the pressure in your arteries as the heart relaxes. What are the causes? The cause of this condition is not known. What increases the risk? Some risk factors for high blood pressure are under your control. Others are not. Factors you can change  Smoking.  Having type 2 diabetes mellitus, high cholesterol, or both.  Not getting enough exercise or physical activity.  Being overweight.  Having too much fat, sugar, calories, or salt (sodium) in your diet.  Drinking too much alcohol. Factors that are difficult or impossible to change  Having chronic kidney disease.  Having a family history of high blood pressure.  Age. Risk increases with age.  Race. You may be at higher risk if you are African-American.  Gender. Men are at higher risk than women before age 45. After age 65, women are at higher risk than men.  Having obstructive sleep apnea.  Stress. What are the signs or symptoms? Extremely high blood pressure (hypertensive crisis) may cause:  Headache.  Anxiety.  Shortness of breath.  Nosebleed.  Nausea and vomiting.  Severe chest pain.  Jerky movements you cannot control (seizures).  How is this  diagnosed? This condition is diagnosed by measuring your blood pressure while you are seated, with your arm resting on a surface. The cuff of the blood pressure monitor will be placed directly against the skin of your upper arm at the level of your heart. It should be measured at least twice using the same arm. Certain conditions can cause a difference in blood pressure between your right and left arms. Certain factors can cause blood pressure readings to be lower or higher than normal (elevated) for a short period of time:  When your blood pressure is higher when you are in a health care provider's office than when you are at home, this is called white coat hypertension. Most people with this condition do not need medicines.  When your blood pressure is higher at home than when you are in a health care provider's office, this is called masked hypertension. Most people with this condition may need medicines to control blood pressure.  If you have a high blood pressure reading during one visit or you have normal blood pressure with other risk factors:  You may be asked to return on a different day to have your blood pressure checked again.  You may be asked to monitor your blood pressure at home for 1 week or longer.  If you are diagnosed with hypertension, you may have other blood or imaging tests to help your health care provider understand your overall risk for other conditions. How is this treated? This condition is treated by making healthy lifestyle changes, such as eating healthy foods, exercising more, and reducing your alcohol intake. Your   health care provider may prescribe medicine if lifestyle changes are not enough to get your blood pressure under control, and if:  Your systolic blood pressure is above 130.  Your diastolic blood pressure is above 80.  Your personal target blood pressure may vary depending on your medical conditions, your age, and other factors. Follow these  instructions at home: Eating and drinking  Eat a diet that is high in fiber and potassium, and low in sodium, added sugar, and fat. An example eating plan is called the DASH (Dietary Approaches to Stop Hypertension) diet. To eat this way: ? Eat plenty of fresh fruits and vegetables. Try to fill half of your plate at each meal with fruits and vegetables. ? Eat whole grains, such as whole wheat pasta, brown rice, or whole grain bread. Fill about one quarter of your plate with whole grains. ? Eat or drink low-fat dairy products, such as skim milk or low-fat yogurt. ? Avoid fatty cuts of meat, processed or cured meats, and poultry with skin. Fill about one quarter of your plate with lean proteins, such as fish, chicken without skin, beans, eggs, and tofu. ? Avoid premade and processed foods. These tend to be higher in sodium, added sugar, and fat.  Reduce your daily sodium intake. Most people with hypertension should eat less than 1,500 mg of sodium a day.  Limit alcohol intake to no more than 1 drink a day for nonpregnant women and 2 drinks a day for men. One drink equals 12 oz of beer, 5 oz of wine, or 1 oz of hard liquor. Lifestyle  Work with your health care provider to maintain a healthy body weight or to lose weight. Ask what an ideal weight is for you.  Get at least 30 minutes of exercise that causes your heart to beat faster (aerobic exercise) most days of the week. Activities may include walking, swimming, or biking.  Include exercise to strengthen your muscles (resistance exercise), such as pilates or lifting weights, as part of your weekly exercise routine. Try to do these types of exercises for 30 minutes at least 3 days a week.  Do not use any products that contain nicotine or tobacco, such as cigarettes and e-cigarettes. If you need help quitting, ask your health care provider.  Monitor your blood pressure at home as told by your health care provider.  Keep all follow-up visits as  told by your health care provider. This is important. Medicines  Take over-the-counter and prescription medicines only as told by your health care provider. Follow directions carefully. Blood pressure medicines must be taken as prescribed.  Do not skip doses of blood pressure medicine. Doing this puts you at risk for problems and can make the medicine less effective.  Ask your health care provider about side effects or reactions to medicines that you should watch for. Contact a health care provider if:  You think you are having a reaction to a medicine you are taking.  You have headaches that keep coming back (recurring).  You feel dizzy.  You have swelling in your ankles.  You have trouble with your vision. Get help right away if:  You develop a severe headache or confusion.  You have unusual weakness or numbness.  You feel faint.  You have severe pain in your chest or abdomen.  You vomit repeatedly.  You have trouble breathing. Summary  Hypertension is when the force of blood pumping through your arteries is too strong. If this condition is not   controlled, it may put you at risk for serious complications.  Your personal target blood pressure may vary depending on your medical conditions, your age, and other factors. For most people, a normal blood pressure is less than 120/80.  Hypertension is treated with lifestyle changes, medicines, or a combination of both. Lifestyle changes include weight loss, eating a healthy, low-sodium diet, exercising more, and limiting alcohol. This information is not intended to replace advice given to you by your health care provider. Make sure you discuss any questions you have with your health care provider. Document Released: 06/17/2005 Document Revised: 05/15/2016 Document Reviewed: 05/15/2016 Elsevier Interactive Patient Education  2018 [employer].   Salads - kale, spinach, cabbage, spring mix; you can also use 1-2 hard boiled  eggs. Fruits - avocadoes, berries (blueberries, raspberries, blackberries), apples, oranges, pomegranate, grapefruit Seeds - quinoa, chia seeds; you can also incorporate oatmeal Vegetables - aspargus, cauliflower, broccoli, green beans, brussel spouts, bell peppers; stay away from starchy vegetables like potatoes, carrots, peas     Diverticulosis Diverticulosis is a condition that develops when small pouches (diverticula) form in the wall of the large intestine (colon). The colon is where water is absorbed and stool is formed. The pouches form when the inside layer of the colon pushes through weak spots in the outer layers of the colon. You may have a few pouches or many of them. What are the causes? The cause of this condition is not known. What increases the risk? The following factors may make you more likely to develop this condition:  Being older than age 66. Your risk for this condition increases with age. Diverticulosis is rare among people younger than age 75. By age 49, many people have it.  Eating a low-fiber diet.  Having frequent constipation.  Being overweight.  Not getting enough exercise.  Smoking.  Taking over-the-counter pain medicines, like aspirin and ibuprofen.  Having a family history of diverticulosis.  What are the signs or symptoms? In most people, there are no symptoms of this condition. If you do have symptoms, they may include:  Bloating.  Cramps in the abdomen.  Constipation or diarrhea.  Pain in the lower left side of the abdomen.  How is this diagnosed? This condition is most often diagnosed during an exam for other colon problems. Because diverticulosis usually has no symptoms, it often cannot be diagnosed independently. This condition may be diagnosed by:  Using a flexible scope to examine the colon (colonoscopy).  Taking an X-ray of the colon after dye has been put into the colon (barium enema).  Doing a CT scan.  How is this  treated? You may not need treatment for this condition if you have never developed an infection related to diverticulosis. If you have had an infection before, treatment may include:  Eating a high-fiber diet. This may include eating more fruits, vegetables, and grains.  Taking a fiber supplement.  Taking a live bacteria supplement (probiotic).  Taking medicine to relax your colon.  Taking antibiotic medicines.  Follow these instructions at home:  Drink 6-8 glasses of water or more each day to prevent constipation.  Try not to strain when you have a bowel movement.  If you have had an infection before: ? Eat more fiber as directed by your health care provider or your diet and nutrition specialist (dietitian). ? Take a fiber supplement or probiotic, if your health care provider approves.  Take over-the-counter and prescription medicines only as told by your health care provider.  If you were prescribed an antibiotic, take it as told by your health care provider. Do not stop taking the antibiotic even if you start to feel better.  Keep all follow-up visits as told by your health care provider. This is important. Contact a health care provider if:  You have pain in your abdomen.  You have bloating.  You have cramps.  You have not had a bowel movement in 3 days. Get help right away if:  Your pain gets worse.  Your bloating becomes very bad.  You have a fever or chills, and your symptoms suddenly get worse.  You vomit.  You have bowel movements that are bloody or black.  You have bleeding from your rectum. Summary  Diverticulosis is a condition that develops when small pouches (diverticula) form in the wall of the large intestine (colon).  You may have a few pouches or many of them.  This condition is most often diagnosed during an exam for other colon problems.  If you have had an infection related to diverticulosis, treatment may include increasing the fiber in  your diet, taking supplements, or taking medicines. This information is not intended to replace advice given to you by your health care provider. Make sure you discuss any questions you have with your health care provider. Document Released: 03/14/2004 Document Revised: 05/06/2016 Document Reviewed: 05/06/2016 Elsevier Interactive Patient Education  2017 [employer]     IF you received an x-ray today, you will receive an invoice from [facility]. Please contact [facility] at [phone] with questions or concerns regarding your invoice.   IF you received labwork today, you will receive an invoice from [facility]. Please contact [facility] at [phone] with questions or concerns regarding your invoice.   Our billing staff will not be able to assist you with questions regarding bills from these companies.  You will be contacted with the lab results as soon as they are available. The fastest way to get your results is to activate your My Chart account. Instructions are located on the last page of this paperwork. If you have not heard from us regarding the results in 2 weeks, please contact this office.

## 2017-12-30 NOTE — Progress Notes (Signed)
    MRN: [id number] DOB: February 12, 1956  Subjective:   Brendan Wagner is a 62 y.o. male presenting for follow up on Hypertension. Currently managed with amlodipine 5 mg, lisinopril 20 mg, hydrochlorothiazide 12.5. Reports that he is doing very well.  Reports that the pain he was having at his last office visit with me is resolved.  Denies dizziness, chronic headache, blurred vision, chest pain, shortness of breath, heart racing, palpitations, nausea, vomiting, abdominal pain, hematuria, lower leg swelling. Denies smoking cigarettes.  He does drink Michelob beers regularly.  Brendan Wagner has a current medication list which includes the following prescription(s): amlodipine, aspirin ec, lisinopril-hydrochlorothiazide, and fish oil. Also has No Known Allergies.  Brendan Wagner  has a past medical history of Diverticulosis and Hypertension. Also  has a past surgical history that includes Appendectomy (1973); Shoulder surgery (2008); Fracture surgery (Left); and Vasectomy.  Objective:   Vitals: BP 127/72 (BP Location: Left Arm, Patient Position: Sitting, Cuff Size: Normal)   Pulse 62   Temp 98.1 F (36.7 C) (Oral)   Ht 5\' 11"  (1.803 m)   Wt 186 lb 12.8 oz (84.7 kg)   SpO2 99%   BMI 26.05 kg/m   The 10-year ASCVD risk score [name], et al., 2013) is: 13.4%   Values used to calculate the score:     Age: [age] years     Sex: Male     Is Non-Hispanic African American: No     Diabetic: No     Tobacco smoker: No     Systolic Blood Pressure: 127 mmHg     Is BP treated: Yes     HDL Cholesterol: 57 mg/dL     Total Cholesterol: 243 mg/dL  Physical Exam  Constitutional: He is oriented to person, place, and time. He appears well-developed and well-nourished.  HENT:  Mouth/Throat: Oropharynx is clear and moist.  Eyes: Pupils are equal, round, and reactive to light. EOM are normal. Right eye exhibits no discharge. Left eye exhibits no discharge. No scleral icterus.  Cardiovascular: Normal rate, regular rhythm and  intact distal pulses. Exam reveals no gallop and no friction rub.  No murmur heard. Pulmonary/Chest: No respiratory distress. He has no wheezes. He has no rales.  Musculoskeletal: He exhibits no edema.  Neurological: He is alert and oriented to person, place, and time.  Skin: Skin is warm and dry.  Psychiatric: He has a normal mood and affect.   Assessment and Plan :   Essential hypertension - Plan: Comprehensive metabolic panel, Lipid panel, amLODipine (NORVASC) 5 MG tablet, Microalbumin / creatinine urine ratio  Mixed hyperlipidemia  Diverticulosis of colon  Blood pressure is well controlled, refilled his medications of lisinopril hydrochlorothiazide and amlodipine.  Labs pending.  Discussed diagnosis of diverticulosis is seen on his colonoscopy report.  Counseled on signs and symptoms of diverticulitis which patient's father suffered from.  Recommended healthy high-fiber diet.  Follow-up in 6 months to 1 year.  Wallis BambergMario Emmalie Haigh, PA-C [facility] [phone] 12/30/2017  4:01 PM

## 2017-12-31 LAB — COMPREHENSIVE METABOLIC PANEL
ALBUMIN: 4.9 g/dL — AB (ref 3.6–4.8)
ALK PHOS: 73 IU/L (ref 39–117)
ALT: 18 IU/L (ref 0–44)
AST: 18 IU/L (ref 0–40)
Albumin/Globulin Ratio: 2 (ref 1.2–2.2)
BUN / CREAT RATIO: 12 (ref 10–24)
BUN: 11 mg/dL (ref 8–27)
Bilirubin Total: 0.5 mg/dL (ref 0.0–1.2)
CO2: 23 mmol/L (ref 20–29)
CREATININE: 0.95 mg/dL (ref 0.76–1.27)
Calcium: 10.3 mg/dL — ABNORMAL HIGH (ref 8.6–10.2)
Chloride: 101 mmol/L (ref 96–106)
GFR calc Af Amer: 98 mL/min/{1.73_m2} (ref >59)
GFR calc non Af Amer: 85 mL/min/{1.73_m2} (ref >59)
GLUCOSE: 81 mg/dL (ref 65–99)
Globulin, Total: 2.4 g/dL (ref 1.5–4.5)
Potassium: 4.1 mmol/L (ref 3.5–5.2)
Sodium: 140 mmol/L (ref 134–144)
TOTAL PROTEIN: 7.3 g/dL (ref 6.0–8.5)

## 2017-12-31 LAB — MICROALBUMIN / CREATININE URINE RATIO
Creatinine, Urine: 65.8 mg/dL
Microalb/Creat Ratio: <4.6 mg/g creat (ref 0.0–30.0)

## 2017-12-31 LAB — LIPID PANEL
Chol/HDL Ratio: 4.2 ratio (ref 0.0–5.0)
Cholesterol, Total: 241 mg/dL — ABNORMAL HIGH (ref 100–199)
HDL: 58 mg/dL (ref >39)
LDL CALC: 144 mg/dL — AB (ref 0–99)
TRIGLYCERIDES: 197 mg/dL — AB (ref 0–149)
VLDL CHOLESTEROL CAL: 39 mg/dL (ref 5–40)

## 2018-01-19 ENCOUNTER — Encounter: Payer: Self-pay | Admitting: Urgent Care

## 2018-05-10 ENCOUNTER — Other Ambulatory Visit: Payer: Self-pay | Admitting: Urgent Care
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
## 2019-08-15 ENCOUNTER — Ambulatory Visit: Payer: Medicare Other | Attending: Internal Medicine

## 2019-08-15 DIAGNOSIS — Z23 Encounter for immunization: Secondary | ICD-10-CM | POA: Insufficient documentation

## 2019-08-15 NOTE — Progress Notes (Signed)
   Covid-19 Vaccination Clinic  Name:  Brendan Wagner    MRN: [id number] DOB: [birth year]  08/15/2019  Brendan Wagner was observed post Covid-19 immunization for 15 minutes without incidence. He was provided with Vaccine Information Sheet and instruction to access the V-Safe system.   Brendan Wagner was instructed to call 911 with any severe reactions post vaccine: Marland Kitchen Difficulty breathing  . Swelling of your face and throat  . A fast heartbeat  . A bad rash all over your body  . Dizziness and weakness    Immunizations Administered    Name Date Dose VIS Date Route   Pfizer COVID-19 Vaccine 08/15/2019 11:09 AM 0.3 mL 06/11/2019 Intramuscular   Manufacturer: [employer], [employer]   Lot: [id number]   NDC: [address]-9

## 2019-09-07 ENCOUNTER — Ambulatory Visit: Payer: Medicare Other | Attending: Internal Medicine

## 2019-09-07 DIAGNOSIS — Z23 Encounter for immunization: Secondary | ICD-10-CM | POA: Insufficient documentation

## 2019-09-07 NOTE — Progress Notes (Signed)
   Covid-19 Vaccination Clinic  Name:  [name]    MRN: [id number] DOB: May 27, 1956  09/07/2019  Mr. Ferrell was observed post Covid-19 immunization for 15 minutes without incident. He was provided with Vaccine Information Sheet and instruction to access the V-Safe system.   Mr. Mierzwa was instructed to call 911 with any severe reactions post vaccine: Marland Kitchen Difficulty breathing  . Swelling of face and throat  . A fast heartbeat  . A bad rash all over body  . Dizziness and weakness   Immunizations Administered    Name Date Dose VIS Date Route   Pfizer COVID-19 Vaccine 09/07/2019  1:38 PM 0.3 mL 06/11/2019 Intramuscular   Manufacturer: [employer], [employer]   Lot: [id number]   NDC: [address]-9

## 2021-12-13 ENCOUNTER — Encounter: Payer: Self-pay | Admitting: Physician Assistant

## 2021-12-24 ENCOUNTER — Other Ambulatory Visit (INDEPENDENT_AMBULATORY_CARE_PROVIDER_SITE_OTHER): Payer: Medicare Other

## 2021-12-24 ENCOUNTER — Encounter: Payer: Self-pay | Admitting: Physician Assistant

## 2021-12-24 ENCOUNTER — Ambulatory Visit: Payer: Medicare Other | Admitting: Physician Assistant

## 2021-12-24 ENCOUNTER — Telehealth: Payer: Self-pay | Admitting: Physician Assistant

## 2021-12-24 VITALS — BP 130/80 | HR 74 | Resp 18 | Wt 185.0 lb

## 2021-12-24 DIAGNOSIS — F1021 Alcohol dependence, in remission: Secondary | ICD-10-CM

## 2021-12-24 DIAGNOSIS — F0390 Unspecified dementia without behavioral disturbance: Secondary | ICD-10-CM | POA: Diagnosis not present

## 2021-12-24 LAB — TSH: TSH: 4.87 u[IU]/mL (ref 0.35–5.50)

## 2021-12-24 LAB — VITAMIN B12: Vitamin B-12: 310 pg/mL (ref 211–911)

## 2021-12-24 MED ORDER — MEMANTINE HCL 10 MG PO TABS: ORAL_TABLET | ORAL | 11 refills | Status: DC
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
## 2021-12-24 NOTE — Telephone Encounter (Signed)
Patient called and left a message stating he has an MRI scheduled and he is claustrophobic and will need something to help with that.  I returned his call and left a voice mail requesting a call back with the date of the MRI and to confirm the pharmacy.

## 2021-12-25 ENCOUNTER — Other Ambulatory Visit: Payer: Self-pay | Admitting: Physician Assistant

## 2021-12-25 MED ORDER — DIAZEPAM 5 MG PO TABS: ORAL_TABLET | ORAL | 0 refills | Status: DC

## 2021-12-27 LAB — VITAMIN B1: Vitamin B1 (Thiamine): 17 nmol/L (ref 8–30)

## 2021-12-27 NOTE — Progress Notes (Signed)
Please let the patient know that his B1 is normal thank you

## 2022-01-06 ENCOUNTER — Other Ambulatory Visit: Payer: Medicare Other

## 2022-01-10 ENCOUNTER — Ambulatory Visit
Admission: RE | Admit: 2022-01-10 | Discharge: 2022-01-10 | Disposition: A | Discharge status: Home / Self Care | Payer: Medicare Other | Source: Ambulatory Visit | Attending: Physician Assistant | Admitting: Physician Assistant

## 2022-01-10 DIAGNOSIS — F0390 Unspecified dementia without behavioral disturbance: Secondary | ICD-10-CM

## 2022-01-14 NOTE — Progress Notes (Signed)
Please inform the patient that the size of the brain is normal, there are some chronic vascular changes.  He needs to make sure that he has good control of the cardiovascular risk factors.  If okay with the family doctor, he should consider taking a baby aspirin a day.

## 2022-01-15 ENCOUNTER — Other Ambulatory Visit: Payer: Self-pay | Admitting: Physician Assistant

## 2022-01-23 ENCOUNTER — Ambulatory Visit: Payer: Medicare Other | Admitting: Physician Assistant

## 2022-01-23 ENCOUNTER — Encounter: Payer: Self-pay | Admitting: Physician Assistant

## 2022-01-23 VITALS — BP 144/75 | HR 70 | Ht 71.0 in | Wt 182.0 lb

## 2022-01-23 DIAGNOSIS — F01A0 Vascular dementia, mild, without behavioral disturbance, psychotic disturbance, mood disturbance, and anxiety: Secondary | ICD-10-CM
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
## 2022-01-23 NOTE — Patient Instructions (Addendum)
It was a pleasure to see you today at our office.   Recommendations:  Follow up in 6 months Continue memantine 10 mg twice daily   Neurocognitive testing   Talk to you PCP about sleeps studies  Agree with bilateral carotid ultrasound Recommend baby aspirin daily   Whom to call:  Memory  decline, memory medications: Call our office [phone]   For psychiatric meds, mood meds: Please have your primary care physician manage these medications.   Counseling regarding caregiver distress, including caregiver depression, anxiety and issues regarding community resources, adult day care programs, adult living facilities, or memory care questions:   Feel free to contact Misty Lisabeth Register, Social Worker at [phone]   For assessment of decision of mental capacity and competency:  Call Dr. Erick Blinks, geriatric psychiatrist at [phone]  For guidance in geriatric dementia issues please call [facility] [phone]  For guidance regarding [facility] and if placement were needed at the facility, contact Sidney Ace, Social Worker tel: [phone]  Consider [facility]  [address], [place] [address] [phone]  Hours of Operation Mondays to Thursdays: 8 am to 8 pm,Fridays: 9 am to 8 pm, Saturdays: 9 am to 1 pm Sundays: Closed  [email or web address]/[email or web address]-[email or web address]-[email or web address]   If you have any severe symptoms of a stroke, or other severe issues such as confusion,severe chills or fever, etc call 911 or go to the ER as you may need to be evaluated further   Feel free to visit Facebook page " [employer]" for tips of how to care for people with memory problems.   Feel free to go to the following database for funded clinical studies conducted around the world: RankChecks.se   https://www.triadclinicaltrials.com/     RECOMMENDATIONS FOR ALL PATIENTS WITH  MEMORY PROBLEMS: 1. Continue to exercise (Recommend 30 minutes of walking everyday, or 3 hours every week) 2. Increase social interactions - continue going to [place] and enjoy social gatherings with friends and family 3. Eat healthy, avoid fried foods and eat more fruits and vegetables 4. Maintain adequate blood pressure, blood sugar, and blood cholesterol level. Reducing the risk of stroke and cardiovascular disease also helps promoting better memory. 5. Avoid stressful situations. Live a simple life and avoid aggravations. Organize your time and prepare for the next day in anticipation. 6. Sleep well, avoid any interruptions of sleep and avoid any distractions in the bedroom that may interfere with adequate sleep quality 7. Avoid sugar, avoid sweets as there is a strong link between excessive sugar intake, diabetes, and cognitive impairment We discussed the Mediterranean diet, which has been shown to help patients reduce the risk of progressive memory disorders and reduces cardiovascular risk. This includes eating fish, eat fruits and green leafy vegetables, nuts like almonds and hazelnuts, walnuts, and also use olive oil. Avoid fast foods and fried foods as much as possible. Avoid sweets and sugar as sugar use has been linked to worsening of memory function.  There is always a concern of gradual progression of memory problems. If this is the case, then we may need to adjust level of care according to patient needs. Support, both to the patient and caregiver, should then be put into place.    The Alzheimer's Association is here all day, every day for people facing Alzheimer's disease through our free 24/7 Helpline: 9808204204. The Helpline provides reliable information and support to all those who need assistance, such as individuals living with memory loss, Alzheimer's or other dementia, caregivers,  health care professionals and the public.  Our highly trained and knowledgeable staff can help you  with: Understanding memory loss, dementia and Alzheimer's  Medications and other treatment options  General information about aging and brain health  Skills to provide quality care and to find the best care from professionals  Legal, financial and living-arrangement decisions Our Helpline also features: Confidential care consultation provided by master's level clinicians who can help with decision-making support, crisis assistance and education on issues families face every day  Help in a caller's preferred language using our translation service that features more than 200 languages and dialects  Referrals to local community programs, services and ongoing support     FALL PRECAUTIONS: Be cautious when walking. Scan the area for obstacles that may increase the risk of trips and falls. When getting up in the mornings, sit up at the edge of the bed for a few minutes before getting out of bed. Consider elevating the bed at the head end to avoid drop of blood pressure when getting up. Walk always in a well-lit room (use night lights in the walls). Avoid area rugs or power cords from appliances in the middle of the walkways. Use a walker or a cane if necessary and consider physical therapy for balance exercise. Get your eyesight checked regularly.  FINANCIAL OVERSIGHT: Supervision, especially oversight when making financial decisions or transactions is also recommended.  HOME SAFETY: Consider the safety of the kitchen when operating appliances like stoves, microwave oven, and blender. Consider having supervision and share cooking responsibilities until no longer able to participate in those. Accidents with firearms and other hazards in the house should be identified and addressed as well.   ABILITY TO BE LEFT ALONE: If patient is unable to contact 911 operator, consider using LifeLine, or when the need is there, arrange for someone to stay with patients. Smoking is a fire hazard, consider supervision  or cessation. Risk of wandering should be assessed by caregiver and if detected at any point, supervision and safe proof recommendations should be instituted.  MEDICATION SUPERVISION: Inability to self-administer medication needs to be constantly addressed. Implement a mechanism to ensure safe administration of the medications.   DRIVING: Regarding driving, in patients with progressive memory problems, driving will be impaired. We advise to have someone else do the driving if trouble finding directions or if minor accidents are reported. Independent driving assessment is available to determine safety of driving.   If you are interested in the driving assessment, you can contact the following:  [facility] in [place] [phone]  [facility] [phone]  [facility] [phone] or [phone]      Mediterranean Diet A Mediterranean diet refers to food and lifestyle choices that are based on the traditions of countries located on the [employer]. This way of eating has been shown to help prevent certain conditions and improve outcomes for people who have chronic diseases, like kidney disease and heart disease. What are tips for following this plan? Lifestyle  Cook and eat meals together with your family, when possible. Drink enough fluid to keep your urine clear or pale yellow. Be physically active every day. This includes: Aerobic exercise like running or swimming. Leisure activities like gardening, walking, or housework. Get 7-8 hours of sleep each night. If recommended by your health care provider, drink red wine in moderation. This means 1 glass a day for nonpregnant women and 2 glasses a day for men. A glass  of wine equals 5 oz (150 mL). Reading food labels  Check the serving size of packaged foods. For foods such as rice and pasta, the serving size refers to the amount of cooked product, not  dry. Check the total fat in packaged foods. Avoid foods that have saturated fat or trans fats. Check the ingredients list for added sugars, such as corn syrup. Shopping  At the grocery store, buy most of your food from the areas near the walls of the store. This includes: Fresh fruits and vegetables (produce). Grains, beans, nuts, and seeds. Some of these may be available in unpackaged forms or large amounts (in bulk). Fresh seafood. Poultry and eggs. Low-fat dairy products. Buy whole ingredients instead of prepackaged foods. Buy fresh fruits and vegetables in-season from local farmers markets. Buy frozen fruits and vegetables in resealable bags. If you do not have access to quality fresh seafood, buy precooked frozen shrimp or canned fish, such as tuna, salmon, or sardines. Buy small amounts of raw or cooked vegetables, salads, or olives from the deli or salad bar at your store. Stock your pantry so you always have certain foods on hand, such as olive oil, canned tuna, canned tomatoes, rice, pasta, and beans. Cooking  Cook foods with extra-virgin olive oil instead of using butter or other vegetable oils. Have meat as a side dish, and have vegetables or grains as your main dish. This means having meat in small portions or adding small amounts of meat to foods like pasta or stew. Use beans or vegetables instead of meat in common dishes like chili or lasagna. Experiment with different cooking methods. Try roasting or broiling vegetables instead of steaming or sauteing them. Add frozen vegetables to soups, stews, pasta, or rice. Add nuts or seeds for added healthy fat at each meal. You can add these to yogurt, salads, or vegetable dishes. Marinate fish or vegetables using olive oil, lemon juice, garlic, and fresh herbs. Meal planning  Plan to eat 1 vegetarian meal one day each week. Try to work up to 2 vegetarian meals, if possible. Eat seafood 2 or more times a week. Have healthy snacks  readily available, such as: Vegetable sticks with hummus. Greek yogurt. Fruit and nut trail mix. Eat balanced meals throughout the week. This includes: Fruit: 2-3 servings a day Vegetables: 4-5 servings a day Low-fat dairy: 2 servings a day Fish, poultry, or lean meat: 1 serving a day Beans and legumes: 2 or more servings a week Nuts and seeds: 1-2 servings a day Whole grains: 6-8 servings a day Extra-virgin olive oil: 3-4 servings a day Limit red meat and sweets to only a few servings a month What are my food choices? Mediterranean diet Recommended Grains: Whole-grain pasta. Brown rice. Bulgar wheat. Polenta. Couscous. Whole-wheat bread. Orpah Cobb. Vegetables: Artichokes. Beets. Broccoli. Cabbage. Carrots. Eggplant. Green beans. Chard. Kale. Spinach. Onions. Leeks. Peas. Squash. Tomatoes. Peppers. Radishes. Fruits: Apples. Apricots. Avocado. Berries. Bananas. Cherries. Dates. Figs. Grapes. Lemons. Melon. Oranges. Peaches. Plums. Pomegranate. Meats and other protein foods: Beans. Almonds. Sunflower seeds. Pine nuts. Peanuts. Cod. Salmon. Scallops. Shrimp. Tuna. Tilapia. Clams. Oysters. Eggs. Dairy: Low-fat milk. Cheese. Greek yogurt. Beverages: Water. Red wine. Herbal tea. Fats and oils: Extra virgin olive oil. Avocado oil. Grape seed oil. Sweets and desserts: [place] yogurt with honey. Baked apples. Poached pears. Trail mix. Seasoning and other foods: Basil. Cilantro. Coriander. Cumin. Mint. Parsley. Sage. Rosemary. Tarragon. Garlic. Oregano. Thyme. Pepper. Balsalmic vinegar. Tahini. Hummus. Tomato sauce. Olives. Mushrooms. Limit these Grains: Prepackaged  pasta or rice dishes. Prepackaged cereal with added sugar. Vegetables: Deep fried potatoes (french fries). Fruits: Fruit canned in syrup. Meats and other protein foods: Beef. Pork. Lamb. Poultry with skin. Hot dogs. Tomasa Blase. Dairy: Ice cream. Sour cream. Whole milk. Beverages: Juice. Sugar-sweetened soft drinks. Beer. Liquor and  spirits. Fats and oils: Butter. Canola oil. Vegetable oil. Beef fat (tallow). Lard. Sweets and desserts: Cookies. Cakes. Pies. Candy. Seasoning and other foods: Mayonnaise. Premade sauces and marinades. The items listed may not be a complete list. Talk with your dietitian about what dietary choices are right for you. Summary The Mediterranean diet includes both food and lifestyle choices. Eat a variety of fresh fruits and vegetables, beans, nuts, seeds, and whole grains. Limit the amount of red meat and sweets that you eat. Talk with your health care provider about whether it is safe for you to drink red wine in moderation. This means 1 glass a day for nonpregnant women and 2 glasses a day for men. A glass of wine equals 5 oz (150 mL). This information is not intended to replace advice given to you by your health care provider. Make sure you discuss any questions you have with your health care provider. Document Released: 02/08/2016 Document Revised: 03/12/2016 Document Reviewed: 02/08/2016 Elsevier Interactive Patient Education  2017 [employer].

## 2022-02-16 ENCOUNTER — Other Ambulatory Visit: Payer: Self-pay | Admitting: Physician Assistant

## 2022-07-04 ENCOUNTER — Ambulatory Visit: Payer: Medicare HMO | Admitting: Psychology

## 2022-07-04 ENCOUNTER — Ambulatory Visit: Payer: Medicare HMO

## 2022-07-04 ENCOUNTER — Encounter: Payer: Self-pay | Admitting: Psychology

## 2022-07-04 DIAGNOSIS — E119 Type 2 diabetes mellitus without complications: Secondary | ICD-10-CM | POA: Insufficient documentation

## 2022-07-04 DIAGNOSIS — F03A0 Unspecified dementia, mild, without behavioral disturbance, psychotic disturbance, mood disturbance, and anxiety: Secondary | ICD-10-CM | POA: Diagnosis not present

## 2022-07-04 DIAGNOSIS — F0280 Dementia in other diseases classified elsewhere without behavioral disturbance: Secondary | ICD-10-CM

## 2022-07-04 DIAGNOSIS — I679 Cerebrovascular disease, unspecified: Secondary | ICD-10-CM | POA: Diagnosis not present

## 2022-07-04 DIAGNOSIS — R4189 Other symptoms and signs involving cognitive functions and awareness: Secondary | ICD-10-CM

## 2022-07-04 DIAGNOSIS — M542 Cervicalgia: Secondary | ICD-10-CM | POA: Insufficient documentation

## 2022-07-04 DIAGNOSIS — F1011 Alcohol abuse, in remission: Secondary | ICD-10-CM | POA: Insufficient documentation

## 2022-07-04 DIAGNOSIS — F0150 Vascular dementia without behavioral disturbance: Secondary | ICD-10-CM

## 2022-07-04 DIAGNOSIS — E785 Hyperlipidemia, unspecified: Secondary | ICD-10-CM | POA: Insufficient documentation

## 2022-07-04 DIAGNOSIS — F1010 Alcohol abuse, uncomplicated: Secondary | ICD-10-CM | POA: Diagnosis not present

## 2022-07-04 HISTORY — DX: Dementia in other diseases classified elsewhere, unspecified severity, without behavioral disturbance, psychotic disturbance, mood disturbance, and anxiety: F01.50

## 2022-07-04 HISTORY — DX: Dementia in other diseases classified elsewhere, unspecified severity, without behavioral disturbance, psychotic disturbance, mood disturbance, and anxiety: F02.80

## 2022-07-04 NOTE — Progress Notes (Signed)
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 (19) <1 Exceptionally Low    Long Delay Free Recall 0/12 (21) <1 Exceptionally Low    Retention Percentage 0 (<19) <1 Exceptionally Low    Recognition Discriminability -3 (20) <1 Exceptionally Low  Shape Learning       Total Trials 1-3 9/27 (29) 2 Well Below Average    Delayed Recall 2/9 (23) <1 Exceptionally Low    Retention Percentage 50 (35) 7 Well Below Average    Recognition Discriminability 1 (22) <1 Exceptionally Low  Story Learning       Immediate Recall 35/80 (29) 2 Well Below Average    Delayed Recall 0/40 (27) 1 Exceptionally Low    Retention Percentage 0 (<19) <1 Exceptionally Low  Daily Living Memory       Immediate Recall 15/51 (19) <1 Exceptionally Low    Delayed Recall 0/17 (19) <1 Exceptionally Low    Retention Percentage 0 (<19) <1 Exceptionally Low    Recognition Hits 3/10 (<19) <1 Exceptionally Low       Attention/Executive Function:     Trail Making Test (TMT): Raw Score (T Score) Percentile     Part A 37 secs.,  0 errors (46) 34 Average    Part B 152 secs.,  2 errors (35) 7 Well Below Average         Scaled Score Percentile   WAIS-IV Coding: 7 16 Below Average       NAB Attention Module, Form 1: T Score  Percentile     Digits Forward 36 8 Well Below Average    Digits Backwards 45 31 Average        Scaled Score Percentile   WAIS-IV Similarities: 7 16 Below Average       D-KEFS Color-Word Interference Test: Raw Score (Scaled Score) Percentile     Color Naming 29 secs. (12) 75 Above Average    Word Reading 20 secs. (12) 75 Above Average    Inhibition 89 secs. (6) 9 Below Average      Total Errors 7 errors (4) 2 Well Below Average    Inhibition/Switching 144 secs. (1) <1 Exceptionally Low      Total Errors 5 errors (8) 25 Average       D-KEFS Verbal  Fluency Test: Raw Score (Scaled Score) Percentile     Letter Total Correct 35 (10) 50 Average    Category Total Correct 25 (6) 9 Below Average    Category Switching Total Correct 3 (1) <1 Exceptionally Low    Category Switching Accuracy 0 (1) <1 Exceptionally Low      Total Set Loss Errors 7 (4) 2 Well Below Average      Total Repetition Errors 7 (6) 9 Below Average       NAB Executive Functions Module, Form 1: T Score Percentile     Judgment 57 75 Above Average       Language:     Verbal Fluency Test: Raw Score (T Score) Percentile     Phonemic Fluency (FAS) 35 (44) 27 Average    Animal Fluency 14 (37) 9 Below Average        NAB Language Module, Form 1: T Score Percentile     Auditory Comprehension 36 8 Well Below Average    Naming 29/31 (41) 18 Below Average       Visuospatial/Visuoconstruction:      Raw Score Percentile   Clock Drawing: 5/10 --- Impaired       NAB Spatial Module, Form 1: T Score Percentile     Figure Drawing Copy 56 73 Average        Scaled Score Percentile   WAIS-IV Block Design: 8 25 Average       Mood and Personality:      Raw Score Percentile   Beck Depression Inventory - II: 4 --- Within Normal Limits  PROMIS Anxiety Questionnaire: 16 --- Mild       Additional Questionnaires:      Raw Score Percentile   PROMIS Sleep Disturbance Questionnaire: 16 --- None to Slight   Informed Consent and  Coding/Compliance:   The current evaluation represents a clinical evaluation for the purposes previously outlined by the referral source and is in no way reflective of a forensic evaluation.   Mr. Revonda Humphreyuckett was provided with a verbal description of the nature and purpose of the present neuropsychological evaluation. Also reviewed were the foreseeable risks and/or discomforts and benefits of the procedure, limits of confidentiality, and mandatory reporting requirements of this provider. The patient was given the opportunity to ask questions and receive answers about the evaluation. Oral consent to participate was provided by the patient.   This evaluation was conducted by Newman NickelsZachary C. Bert Ptacek, Ph.D., ABPP-CN, board certified clinical neuropsychologist. Mr. Revonda Humphreyuckett completed a clinical interview with Dr. Milbert CoulterMerz, billed as one unit [phone], and 140 minutes of cognitive testing and scoring, billed as one unit [phone] and four additional units [id number]. Psychometrist Shan LevansKim Shuttleworth, B.S., assisted Dr. Milbert CoulterMerz with test administration and scoring procedures. As a separate and discrete service, Dr. Milbert CoulterMerz spent a total of 160 minutes in interpretation and report writing billed as one unit [phone] and two units 96133.

## 2022-07-04 NOTE — Progress Notes (Signed)
   Psychometrician Note   Cognitive testing was administered to Brendan Wagner by Cruzita Lederer, B.S. (psychometrist) under the supervision of Dr. Christia Reading, Ph.D., licensed psychologist on 07/04/2022. [name] did not appear overtly distressed by the testing session per behavioral observation or responses across self-report questionnaires. Rest breaks were offered.    The battery of tests administered was selected by Dr. Christia Reading, Ph.D. with consideration to [name]'s current level of functioning, the nature of his symptoms, emotional and behavioral responses during interview, level of literacy, observed level of motivation/effort, and the nature of the referral question. This battery was communicated to the psychometrist. Communication between Dr. Christia Reading, Ph.D. and the psychometrist was ongoing throughout the evaluation and Dr. Christia Reading, Ph.D. was immediately accessible at all times. Dr. Christia Reading, Ph.D. provided supervision to the psychometrist on the date of this service to the extent necessary to assure the quality of all services provided.    Brendan Wagner will return within approximately 1-2 weeks for an interactive feedback session with Dr. Melvyn Novas at which time his test performances, clinical impressions, and treatment recommendations will be reviewed in detail. [name] understands he can contact our office should he require our assistance before this time.  A total of 140 minutes of billable time were spent face-to-face with [name] by the psychometrist. This includes both test administration and scoring time. Billing for these services is reflected in the clinical report generated by Dr. Christia Reading, Ph.D.  This note reflects time spent with the psychometrician and does not include test scores or any clinical interpretations made by Dr. Melvyn Novas. The full report will follow in a separate note.

## 2022-07-09 ENCOUNTER — Encounter: Payer: Medicare Other | Admitting: Psychology

## 2022-07-12 ENCOUNTER — Ambulatory Visit (INDEPENDENT_AMBULATORY_CARE_PROVIDER_SITE_OTHER): Payer: Medicare HMO | Admitting: Psychology

## 2022-07-12 DIAGNOSIS — F1010 Alcohol abuse, uncomplicated: Secondary | ICD-10-CM | POA: Diagnosis not present

## 2022-07-12 DIAGNOSIS — F03A0 Unspecified dementia, mild, without behavioral disturbance, psychotic disturbance, mood disturbance, and anxiety: Secondary | ICD-10-CM | POA: Diagnosis not present

## 2022-07-12 DIAGNOSIS — I679 Cerebrovascular disease, unspecified: Secondary | ICD-10-CM | POA: Diagnosis not present

## 2022-07-12 NOTE — Progress Notes (Signed)
   Neuropsychology Feedback Session Brendan Wagner. [facility]  Reason for Referral:   Brendan Wagner is a 67 y.o. right-handed Caucasian male referred by Sharene Butters, PA-C, to characterize his current cognitive functioning and assist with diagnostic clarity and treatment planning in the context of subjective cognitive decline and recent neuroimaging suggesting advanced cerebrovascular disease.   Feedback:   Brendan Wagner completed a comprehensive neuropsychological evaluation on 07/04/2022. Please refer to that encounter for the full report and recommendations. Briefly, results suggested profound impairment surrounding all aspects of learning and memory. Isolated weaknesses were exhibited across receptive language and his drawing of a clock, while performance variability was exhibited across attention/concentration and executive functioning. The etiology for ongoing dysfunction is unclear. Recent neuroimaging suggested quite severe, age-advanced cerebrovascular disease. When present to significant levels, cerebrovascular disease can certainly create cognitive dysfunction, especially surrounding attention/concentration, executive functioning, and learning/memory. While it is unclear if this represents the primary cause for impairment (i.e., a vascular dementia presentation), it at least likely represents a notable contributory factor. It is also necessary to highlight that Brendan Wagner reported consuming 4-6 beers nightly. Ongoing and chronic alcohol abuse will certainly impair cognitive abilities and increase his risk for an alcohol-related dementia presentation. His pattern of current deficits, including amnestic memory, are reasonably consistent with these dementia presentations, increasing the risk for a mixed dementia presentation (i.e., a dementia presentation created by more than one cause). Alzheimer's disease cannot be ruled out. Across memory testing, he did not  benefit from repeated exposure to information, was fully amnestic (i.e., 0% retention) across all three verbal tasks, and performed very poorly across all yes/no recognition trials. Taken together, this suggests rapid forgetting and a quite significant storage impairment, both of which are very concerning for this illness. However, other non-memory areas typically impacted in this illness early on were generally adequate and the presence of this illness cannot be definitely stated given ongoing alcohol abuse.   Brendan Wagner was accompanied by his wife during the current feedback session. Content of the current session focused on the results of his neuropsychological evaluation. Brendan Wagner was given the opportunity to ask questions and his questions were answered. He was encouraged to reach out should additional questions arise. A copy of his report was provided at the conclusion of the visit.      40 minutes were spent conducting the current feedback session with Brendan Wagner, billed as one unit [phone].

## 2022-07-26 ENCOUNTER — Ambulatory Visit: Payer: Medicare HMO | Admitting: Physician Assistant

## 2022-07-26 ENCOUNTER — Encounter: Payer: Self-pay | Admitting: Physician Assistant

## 2022-07-26 VITALS — BP 132/78 | HR 67 | Resp 18 | Ht 71.0 in | Wt 181.0 lb

## 2022-07-26 DIAGNOSIS — F03A0 Unspecified dementia, mild, without behavioral disturbance, psychotic disturbance, mood disturbance, and anxiety: Secondary | ICD-10-CM

## 2022-07-26 MED ORDER — MEMANTINE HCL 10 MG PO TABS: ORAL_TABLET | ORAL | 3 refills | Status: DC
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
## 2022-07-26 NOTE — Patient Instructions (Signed)
It was a pleasure to see you today at our office.   Recommendations:  Follow up in 6 months Continue memantine 10 mg twice daily   Neurocognitive testing scheduled for 1 y from now     Whom to call:  Memory  decline, memory medications: Call our office [phone]   For psychiatric meds, mood meds: Please have your primary care physician manage these medications.   Counseling regarding caregiver distress, including caregiver depression, anxiety and issues regarding community resources, adult day care programs, adult living facilities, or memory care questions:   Feel free to contact Misty Lisabeth Register, Social Worker at [phone]   For assessment of decision of mental capacity and competency:  Call Dr. Erick Blinks, geriatric psychiatrist at [phone]  For guidance in geriatric dementia issues please call [facility] [phone]  If you have any severe symptoms of a stroke, or other severe issues such as confusion,severe chills or fever, etc call 911 or go to the ER as you may need to be evaluated further  RECOMMENDATIONS FOR ALL PATIENTS WITH MEMORY PROBLEMS: 1. Continue to exercise (Recommend 30 minutes of walking everyday, or 3 hours every week) 2. Increase social interactions - continue going to [place] and enjoy social gatherings with friends and family 3. Eat healthy, avoid fried foods and eat more fruits and vegetables 4. Maintain adequate blood pressure, blood sugar, and blood cholesterol level. Reducing the risk of stroke and cardiovascular disease also helps promoting better memory. 5. Avoid stressful situations. Live a simple life and avoid aggravations. Organize your time and prepare for the next day in anticipation. 6. Sleep well, avoid any interruptions of sleep and avoid any distractions in the bedroom that may interfere with adequate sleep quality 7. Avoid sugar, avoid sweets as there is a strong link between excessive sugar intake, diabetes, and  cognitive impairment We discussed the Mediterranean diet, which has been shown to help patients reduce the risk of progressive memory disorders and reduces cardiovascular risk. This includes eating fish, eat fruits and green leafy vegetables, nuts like almonds and hazelnuts, walnuts, and also use olive oil. Avoid fast foods and fried foods as much as possible. Avoid sweets and sugar as sugar use has been linked to worsening of memory function.  There is always a concern of gradual progression of memory problems. If this is the case, then we may need to adjust level of care according to patient needs. Support, both to the patient and caregiver, should then be put into place.    The Alzheimer's Association is here all day, every day for people facing Alzheimer's disease through our free 24/7 Helpline: 7145210721. The Helpline provides reliable information and support to all those who need assistance, such as individuals living with memory loss, Alzheimer's or other dementia, caregivers, health care professionals and the public.  Our highly trained and knowledgeable staff can help you with: Understanding memory loss, dementia and Alzheimer's  Medications and other treatment options  General information about aging and brain health  Skills to provide quality care and to find the best care from professionals  Legal, financial and living-arrangement decisions Our Helpline also features: Confidential care consultation provided by master's level clinicians who can help with decision-making support, crisis assistance and education on issues families face every day  Help in a caller's preferred language using our translation service that features more than 200 languages and dialects  Referrals to local community programs, services and ongoing support     FALL PRECAUTIONS: Be cautious  when walking. Scan the area for obstacles that may increase the risk of trips and falls. When getting up in the mornings,  sit up at the edge of the bed for a few minutes before getting out of bed. Consider elevating the bed at the head end to avoid drop of blood pressure when getting up. Walk always in a well-lit room (use night lights in the walls). Avoid area rugs or power cords from appliances in the middle of the walkways. Use a walker or a cane if necessary and consider physical therapy for balance exercise. Get your eyesight checked regularly.  FINANCIAL OVERSIGHT: Supervision, especially oversight when making financial decisions or transactions is also recommended.  HOME SAFETY: Consider the safety of the kitchen when operating appliances like stoves, microwave oven, and blender. Consider having supervision and share cooking responsibilities until no longer able to participate in those. Accidents with firearms and other hazards in the house should be identified and addressed as well.   ABILITY TO BE LEFT ALONE: If patient is unable to contact 911 operator, consider using LifeLine, or when the need is there, arrange for someone to stay with patients. Smoking is a fire hazard, consider supervision or cessation. Risk of wandering should be assessed by caregiver and if detected at any point, supervision and safe proof recommendations should be instituted.  MEDICATION SUPERVISION: Inability to self-administer medication needs to be constantly addressed. Implement a mechanism to ensure safe administration of the medications.   DRIVING: Regarding driving, in patients with progressive memory problems, driving will be impaired. We advise to have someone else do the driving if trouble finding directions or if minor accidents are reported. Independent driving assessment is available to determine safety of driving.   If you are interested in the driving assessment, you can contact the following:  [employer] in [place]  [place] [phone] or [phone]      [place] refers to food and lifestyle choices that are based on the traditions of countries located on the [employer]. This way of eating has been shown to help prevent certain conditions and improve outcomes for people who have chronic diseases, like kidney disease and heart disease. What are tips for following this plan? Lifestyle  Cook and eat meals together with your family, when possible. Drink enough fluid to keep your urine clear or pale yellow. Be physically active every day. This includes: Aerobic exercise like running or swimming. Leisure activities like gardening, walking, or housework. Get 7-8 hours of sleep each night. If recommended by your health care provider, drink red wine in moderation. This means 1 glass a day for nonpregnant women and 2 glasses a day for men. A glass of wine equals 5 oz (150 mL). Reading food labels  Check the serving size of packaged foods. For foods such as rice and pasta, the serving size refers to the amount of cooked product, not dry. Check the total fat in packaged foods. Avoid foods that have saturated fat or trans fats. Check the ingredients list for added sugars, such as corn syrup. Shopping  At the grocery store, buy most of your food from the areas near the walls of the store. This includes: Fresh fruits and vegetables (produce). Grains, beans, nuts, and seeds. Some of these may be available in unpackaged forms or large amounts (in bulk). Fresh seafood. Poultry and eggs. Low-fat dairy products. Buy whole ingredients instead  of prepackaged foods. Buy fresh fruits and vegetables in-season from local farmers markets. Buy frozen fruits and vegetables in resealable bags. If you do not have access to quality fresh seafood, buy precooked frozen shrimp or canned fish, such as tuna, salmon, or sardines. Buy small amounts of raw or cooked  vegetables, salads, or olives from the deli or salad bar at your store. Stock your pantry so you always have certain foods on hand, such as olive oil, canned tuna, canned tomatoes, rice, pasta, and beans. Cooking  Cook foods with extra-virgin olive oil instead of using butter or other vegetable oils. Have meat as a side dish, and have vegetables or grains as your main dish. This means having meat in small portions or adding small amounts of meat to foods like pasta or stew. Use beans or vegetables instead of meat in common dishes like chili or lasagna. Experiment with different cooking methods. Try roasting or broiling vegetables instead of steaming or sauteing them. Add frozen vegetables to soups, stews, pasta, or rice. Add nuts or seeds for added healthy fat at each meal. You can add these to yogurt, salads, or vegetable dishes. Marinate fish or vegetables using olive oil, lemon juice, garlic, and fresh herbs. Meal planning  Plan to eat 1 vegetarian meal one day each week. Try to work up to 2 vegetarian meals, if possible. Eat seafood 2 or more times a week. Have healthy snacks readily available, such as: Vegetable sticks with hummus. Greek yogurt. Fruit and nut trail mix. Eat balanced meals throughout the week. This includes: Fruit: 2-3 servings a day Vegetables: 4-5 servings a day Low-fat dairy: 2 servings a day Fish, poultry, or lean meat: 1 serving a day Beans and legumes: 2 or more servings a week Nuts and seeds: 1-2 servings a day Whole grains: 6-8 servings a day Extra-virgin olive oil: 3-4 servings a day Limit red meat and sweets to only a few servings a month What are my food choices? Mediterranean diet Recommended Grains: Whole-grain pasta. Brown rice. Bulgar wheat. Polenta. Couscous. Whole-wheat bread. [place]. Vegetables: Artichokes. Beets. Broccoli. Cabbage. Carrots. Eggplant. Green beans. Chard. Kale. Spinach. Onions. Leeks. Peas. Squash. Tomatoes. Peppers.  Radishes. Fruits: Apples. Apricots. Avocado. Berries. Bananas. Cherries. Dates. Figs. Grapes. Lemons. Melon. Oranges. Peaches. Plums. Pomegranate. Meats and other protein foods: Beans. Almonds. Sunflower seeds. Pine nuts. Peanuts. [place]. Salmon. Scallops. Shrimp. [place]. Tilapia. Clams. Oysters. Eggs. Dairy: Low-fat milk. Cheese. Greek yogurt. Beverages: Water. Red wine. Herbal tea. Fats and oils: Extra virgin olive oil. Avocado oil. Grape seed oil. Sweets and desserts: [place] yogurt with honey. Baked apples. Poached pears. Trail mix. Seasoning and other foods: Basil. Cilantro. Coriander. Cumin. Mint. Parsley. Sage. Rosemary. Tarragon. Garlic. Oregano. Thyme. Pepper. Balsalmic vinegar. Tahini. Hummus. Tomato sauce. Olives. Mushrooms. Limit these Grains: Prepackaged pasta or rice dishes. Prepackaged cereal with added sugar. Vegetables: Deep fried potatoes (french fries). Fruits: Fruit canned in syrup. Meats and other protein foods: Beef. Pork. Lamb. Poultry with skin. Hot dogs. Berniece Salines. Dairy: Ice cream. Sour cream. Whole milk. Beverages: Juice. Sugar-sweetened soft drinks. Beer. Liquor and spirits. Fats and oils: Butter. Canola oil. Vegetable oil. Beef fat (tallow). Lard. Sweets and desserts: Cookies. Cakes. Pies. Candy. Seasoning and other foods: Mayonnaise. Premade sauces and marinades. The items listed may not be a complete list. Talk with your dietitian about what dietary choices are right for you. Summary The Mediterranean diet includes both food and lifestyle choices. Eat a variety of fresh fruits and vegetables, beans, nuts, seeds, and whole  grains. Limit the amount of red meat and sweets that you eat. Talk with your health care provider about whether it is safe for you to drink red wine in moderation. This means 1 glass a day for nonpregnant women and 2 glasses a day for men. A glass of wine equals 5 oz (150 mL). This information is not intended to replace advice given to you by your health  care provider. Make sure you discuss any questions you have with your health care provider. Document Released: 02/08/2016 Document Revised: 03/12/2016 Document Reviewed: 02/08/2016 Elsevier Interactive Patient Education  2017 [employer].

## 2022-09-02 ENCOUNTER — Encounter: Payer: Medicare Other | Admitting: Psychology

## 2022-09-11 ENCOUNTER — Encounter: Payer: Medicare Other | Admitting: Psychology

## 2022-09-24 ENCOUNTER — Other Ambulatory Visit: Payer: Self-pay | Admitting: Orthopedic Surgery

## 2022-10-01 ENCOUNTER — Other Ambulatory Visit: Payer: Self-pay

## 2022-10-01 ENCOUNTER — Encounter (HOSPITAL_BASED_OUTPATIENT_CLINIC_OR_DEPARTMENT_OTHER): Payer: Self-pay | Admitting: Orthopedic Surgery

## 2022-10-02 ENCOUNTER — Encounter (HOSPITAL_BASED_OUTPATIENT_CLINIC_OR_DEPARTMENT_OTHER)
Admission: RE | Admit: 2022-10-02 | Discharge: 2022-10-02 | Disposition: A | Discharge status: Home / Self Care | Payer: Medicare HMO | Source: Ambulatory Visit | Attending: Orthopedic Surgery | Admitting: Orthopedic Surgery

## 2022-10-02 DIAGNOSIS — Z01818 Encounter for other preprocedural examination: Secondary | ICD-10-CM | POA: Diagnosis present

## 2022-10-02 LAB — BASIC METABOLIC PANEL
Anion gap: 10 (ref 5–15)
BUN: 14 mg/dL (ref 8–23)
CO2: 25 mmol/L (ref 22–32)
Calcium: 9.5 mg/dL (ref 8.9–10.3)
Chloride: 101 mmol/L (ref 98–111)
Creatinine, Ser: 0.93 mg/dL (ref 0.61–1.24)
GFR, Estimated: >60 mL/min (ref >60)
Glucose, Bld: 96 mg/dL (ref 70–99)
Potassium: 3.7 mmol/L (ref 3.5–5.1)
Sodium: 136 mmol/L (ref 135–145)

## 2022-10-02 NOTE — Progress Notes (Signed)
       Patient Instructions  The night before surgery:  No food after midnight. ONLY clear liquids after midnight  The day of surgery (if you do NOT have diabetes):  Drink ONE (1) Pre-Surgery Clear Ensure as directed.   This drink was given to you during your hospital  pre-op appointment visit. The pre-op nurse will instruct you on the time to drink the  Pre-Surgery Ensure depending on your surgery time. Finish the drink at the designated time by the pre-op nurse.  Nothing else to drink after completing the  Pre-Surgery Clear Ensure.  The day of surgery (if you have diabetes): Drink ONE (1) Gatorade 2 (G2) as directed. This drink was given to you during your hospital  pre-op appointment visit.  The pre-op nurse will instruct you on the time to drink the   Gatorade 2 (G2) depending on your surgery time. Color of the Gatorade may vary. Red is not allowed. Nothing else to drink after completing the  Gatorade 2 (G2).         If you have questions, please contact your surgeon's office.      Patient Instructions  The night before surgery:  No food after midnight. ONLY clear liquids after midnight  The day of surgery (if you do NOT have diabetes):  Drink ONE (1) Pre-Surgery Clear Ensure as directed.   This drink was given to you during your hospital  pre-op appointment visit. The pre-op nurse will instruct you on the time to drink the  Pre-Surgery Ensure depending on your surgery time. Finish the drink at the designated time by the pre-op nurse.  Nothing else to drink after completing the  Pre-Surgery Clear Ensure.  The day of surgery (if you have diabetes): Drink ONE (1) Gatorade 2 (G2) as directed. This drink was given to you during your hospital  pre-op appointment visit.  The pre-op nurse will instruct you on the time to drink the   Gatorade 2 (G2) depending on your surgery time. Color of the Gatorade may vary. Red is not allowed. Nothing else to drink after  completing the  Gatorade 2 (G2).         If you have questions, please contact your surgeon's office.   

## 2022-10-08 ENCOUNTER — Ambulatory Visit (HOSPITAL_BASED_OUTPATIENT_CLINIC_OR_DEPARTMENT_OTHER): Payer: Medicare HMO | Admitting: Anesthesiology

## 2022-10-08 ENCOUNTER — Encounter (HOSPITAL_BASED_OUTPATIENT_CLINIC_OR_DEPARTMENT_OTHER)
Admission: RE | Disposition: A | Discharge status: Home / Self Care | Payer: Self-pay | Source: Home / Self Care | Attending: Orthopedic Surgery

## 2022-10-08 ENCOUNTER — Other Ambulatory Visit: Payer: Self-pay

## 2022-10-08 ENCOUNTER — Encounter (HOSPITAL_BASED_OUTPATIENT_CLINIC_OR_DEPARTMENT_OTHER): Payer: Self-pay | Admitting: Orthopedic Surgery

## 2022-10-08 ENCOUNTER — Ambulatory Visit (HOSPITAL_BASED_OUTPATIENT_CLINIC_OR_DEPARTMENT_OTHER)
Admission: RE | Admit: 2022-10-08 | Discharge: 2022-10-08 | Disposition: A | Discharge status: Home / Self Care | Payer: Medicare HMO | Attending: Orthopedic Surgery | Admitting: Orthopedic Surgery

## 2022-10-08 DIAGNOSIS — M19041 Primary osteoarthritis, right hand: Secondary | ICD-10-CM

## 2022-10-08 DIAGNOSIS — Z01818 Encounter for other preprocedural examination: Secondary | ICD-10-CM

## 2022-10-08 DIAGNOSIS — M71341 Other bursal cyst, right hand: Secondary | ICD-10-CM

## 2022-10-08 DIAGNOSIS — I10 Essential (primary) hypertension: Secondary | ICD-10-CM | POA: Insufficient documentation

## 2022-10-08 DIAGNOSIS — E119 Type 2 diabetes mellitus without complications: Secondary | ICD-10-CM | POA: Diagnosis not present

## 2022-10-08 DIAGNOSIS — Z8249 Family history of ischemic heart disease and other diseases of the circulatory system: Secondary | ICD-10-CM | POA: Insufficient documentation

## 2022-10-08 DIAGNOSIS — F03A0 Unspecified dementia, mild, without behavioral disturbance, psychotic disturbance, mood disturbance, and anxiety: Secondary | ICD-10-CM | POA: Insufficient documentation

## 2022-10-08 DIAGNOSIS — M67441 Ganglion, right hand: Secondary | ICD-10-CM | POA: Insufficient documentation

## 2022-10-08 DIAGNOSIS — Z79899 Other long term (current) drug therapy: Secondary | ICD-10-CM

## 2022-10-08 HISTORY — PX: CYST EXCISION: SHX5701

## 2022-10-08 SURGERY — CYST REMOVAL
Anesthesia: Monitor Anesthesia Care | Site: Middle Finger | Laterality: Right

## 2022-10-08 MED ORDER — ACETAMINOPHEN 325 MG PO TABS: 325.0000 mg | ORAL_TABLET | ORAL | Status: DC | PRN

## 2022-10-08 MED ORDER — ACETAMINOPHEN 160 MG/5ML PO SOLN: 325.0000 mg | ORAL | Status: DC | PRN

## 2022-10-08 MED ORDER — BUPIVACAINE HCL (PF) 0.25 % IJ SOLN
INTRAMUSCULAR | Status: AC
  Filled 2022-10-08: qty 30

## 2022-10-08 MED ORDER — CEFAZOLIN SODIUM-DEXTROSE 2-4 GM/100ML-% IV SOLN
INTRAVENOUS | Status: AC
  Filled 2022-10-08: qty 100

## 2022-10-08 MED ORDER — PROPOFOL 10 MG/ML IV BOLUS
INTRAVENOUS | Status: DC | PRN
  Administered 2022-10-08: 20 mg via INTRAVENOUS

## 2022-10-08 MED ORDER — FENTANYL CITRATE (PF) 100 MCG/2ML IJ SOLN: 25.0000 ug | INTRAMUSCULAR | Status: DC | PRN

## 2022-10-08 MED ORDER — OXYCODONE HCL 5 MG PO TABS: 5.0000 mg | ORAL_TABLET | Freq: Once | ORAL | Status: DC | PRN

## 2022-10-08 MED ORDER — TRAMADOL HCL 50 MG PO TABS: ORAL_TABLET | ORAL | 0 refills | Status: AC

## 2022-10-08 MED ORDER — MEPERIDINE HCL 25 MG/ML IJ SOLN: 6.2500 mg | INTRAMUSCULAR | Status: DC | PRN

## 2022-10-08 MED ORDER — 0.9 % SODIUM CHLORIDE (POUR BTL) OPTIME
TOPICAL | Status: DC | PRN
  Administered 2022-10-08: 120 mL

## 2022-10-08 MED ORDER — PROPOFOL 500 MG/50ML IV EMUL
INTRAVENOUS | Status: DC | PRN
  Administered 2022-10-08: 100 ug/kg/min via INTRAVENOUS

## 2022-10-08 MED ORDER — LACTATED RINGERS IV SOLN: INTRAVENOUS | Status: DC

## 2022-10-08 MED ORDER — LIDOCAINE HCL (CARDIAC) PF 100 MG/5ML IV SOSY
PREFILLED_SYRINGE | INTRAVENOUS | Status: DC | PRN
  Administered 2022-10-08: 20 mg via INTRAVENOUS

## 2022-10-08 MED ORDER — FENTANYL CITRATE (PF) 100 MCG/2ML IJ SOLN
INTRAMUSCULAR | Status: DC | PRN
  Administered 2022-10-08: 50 ug via INTRAVENOUS

## 2022-10-08 MED ORDER — BUPIVACAINE HCL (PF) 0.25 % IJ SOLN
INTRAMUSCULAR | Status: DC | PRN
  Administered 2022-10-08: 9 mL

## 2022-10-08 MED ORDER — OXYCODONE HCL 5 MG/5ML PO SOLN: 5.0000 mg | Freq: Once | ORAL | Status: DC | PRN

## 2022-10-08 MED ORDER — ONDANSETRON HCL 4 MG/2ML IJ SOLN: 4.0000 mg | Freq: Once | INTRAMUSCULAR | Status: DC | PRN

## 2022-10-08 MED ORDER — ONDANSETRON HCL 4 MG/2ML IJ SOLN
INTRAMUSCULAR | Status: DC | PRN
  Administered 2022-10-08: 4 mg via INTRAVENOUS

## 2022-10-08 MED ORDER — MIDAZOLAM HCL 2 MG/2ML IJ SOLN
INTRAMUSCULAR | Status: AC
  Filled 2022-10-08: qty 2

## 2022-10-08 MED ORDER — FENTANYL CITRATE (PF) 100 MCG/2ML IJ SOLN
INTRAMUSCULAR | Status: AC
  Filled 2022-10-08: qty 2

## 2022-10-08 MED ORDER — CEFAZOLIN SODIUM-DEXTROSE 2-4 GM/100ML-% IV SOLN
2.0000 g | INTRAVENOUS | Status: AC
  Administered 2022-10-08: 2 g via INTRAVENOUS

## 2022-10-08 SURGICAL SUPPLY — 32 items
APL PRP STRL LF DISP 70% ISPRP (MISCELLANEOUS) ×2
BLADE SURG 15 STRL LF DISP TIS (BLADE) ×4 IMPLANT
BLADE SURG 15 STRL SS (BLADE) ×4
BNDG CMPR 9X4 STRL LF SNTH (GAUZE/BANDAGES/DRESSINGS)
BNDG COHESIVE 1X5 TAN STRL LF (GAUZE/BANDAGES/DRESSINGS) ×1 IMPLANT
BNDG ESMARK 4X9 LF (GAUZE/BANDAGES/DRESSINGS) IMPLANT
BNDG GAUZE DERMACEA FLUFF 4 (GAUZE/BANDAGES/DRESSINGS) IMPLANT
BNDG GZE DERMACEA 4 6PLY (GAUZE/BANDAGES/DRESSINGS)
CHLORAPREP W/TINT 26 (MISCELLANEOUS) ×2 IMPLANT
CORD BIPOLAR FORCEPS 12FT (ELECTRODE) ×2 IMPLANT
COVER BACK TABLE 60X90IN (DRAPES) ×2 IMPLANT
COVER MAYO STAND STRL (DRAPES) ×2 IMPLANT
CUFF TOURN SGL QUICK 18X4 (TOURNIQUET CUFF) ×2 IMPLANT
DRAPE EXTREMITY T 121X128X90 (DISPOSABLE) ×2 IMPLANT
DRAPE SURG 17X23 STRL (DRAPES) ×2 IMPLANT
GAUZE SPONGE 4X4 12PLY STRL (GAUZE/BANDAGES/DRESSINGS) ×2 IMPLANT
GAUZE XEROFORM 1X8 LF (GAUZE/BANDAGES/DRESSINGS) ×2 IMPLANT
GLOVE BIO SURGEON STRL SZ7.5 (GLOVE) ×2 IMPLANT
GLOVE BIOGEL PI IND STRL 8 (GLOVE) ×2 IMPLANT
GOWN STRL REUS W/ TWL LRG LVL3 (GOWN DISPOSABLE) ×2 IMPLANT
GOWN STRL REUS W/TWL LRG LVL3 (GOWN DISPOSABLE) ×2
NDL HYPO 25X1 1.5 SAFETY (NEEDLE) ×1 IMPLANT
NEEDLE HYPO 25X1 1.5 SAFETY (NEEDLE) ×2 IMPLANT
NS IRRIG 1000ML POUR BTL (IV SOLUTION) ×2 IMPLANT
PACK BASIN DAY SURGERY FS (CUSTOM PROCEDURE TRAY) ×2 IMPLANT
SPLINT FINGER 3.25 BULB 911905 (SOFTGOODS) ×1 IMPLANT
STOCKINETTE 4X48 STRL (DRAPES) ×2 IMPLANT
SUT ETHILON 4 0 PS 2 18 (SUTURE) ×2 IMPLANT
SYR BULB EAR ULCER 3OZ GRN STR (SYRINGE) ×2 IMPLANT
SYR CONTROL 10ML LL (SYRINGE) ×2 IMPLANT
TOWEL GREEN STERILE FF (TOWEL DISPOSABLE) ×4 IMPLANT
UNDERPAD 30X36 HEAVY ABSORB (UNDERPADS AND DIAPERS) ×2 IMPLANT

## 2022-10-08 NOTE — Discharge Instructions (Addendum)
  Post Anesthesia Home Care Instructions  Activity: Get plenty of rest for the remainder of the day. A responsible individual must stay with you for 24 hours following the procedure.  For the next 24 hours, DO NOT: -Drive a car -Operate machinery -Drink alcoholic beverages -Take any medication unless instructed by your physician -Make any legal decisions or sign important papers.  Meals: Start with liquid foods such as gelatin or soup. Progress to regular foods as tolerated. Avoid greasy, spicy, heavy foods. If nausea and/or vomiting occur, drink only clear liquids until the nausea and/or vomiting subsides. Call your physician if vomiting continues.  Special Instructions/Symptoms: Your throat may feel dry or sore from the anesthesia or the breathing tube placed in your throat during surgery. If this causes discomfort, gargle with warm salt water. The discomfort should disappear within 24 hours.    [facility] Instructions Hand Surgery  Wound Care: Keep your hand elevated above the level of your heart.  Do not allow it to dangle by your side.  Keep the dressing dry and do not remove it unless your doctor advises you to do so.  He will usually change it at the time of your post-op visit.  Moving your fingers is advised to stimulate circulation but will depend on the site of your surgery.  If you have a splint applied, your doctor will advise you regarding movement.  Activity: Do not drive or operate machinery today.  Rest today and then you may return to your normal activity and work as indicated by your physician.  Diet:  Drink liquids today or eat a light diet.  You may resume a regular diet tomorrow.    General expectations: Pain for two to three days. Fingers may become slightly swollen.  Call your doctor if any of the following occur: Severe pain not relieved by pain medication. Elevated temperature. Dressing soaked with blood. Inability to move fingers. White or bluish  color to fingers.      

## 2022-10-08 NOTE — Op Note (Signed)
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  Right long finger excision mucoid cyst 2.  Right long finger DIP joint debridement   SURGEON:  Betha Loa, M.D.   ASSISTANT: none   ANESTHESIA:  Bier block with sedation   INTRAVENOUS FLUIDS:  Per anesthesia flow sheet.   ESTIMATED BLOOD LOSS:  Minimal.   COMPLICATIONS:  None.   SPECIMENS: Right long finger mucoid cyst to pathology   TOURNIQUET TIME:    Total Tourniquet Time Documented: Forearm (Right) - 27 minutes Total: Forearm (Right) - 27 minutes    DISPOSITION:  Stable to PACU.   INDICATIONS: 67 year old male with cyst of right long finger.  It is bothersome to him.  He wishes to have it removed and the DIP joint debrided to try to prevent recurrence.  Risks, benefits and alternatives of surgery were discussed including the risks of blood loss, infection, damage to nerves, vessels, tendons, ligaments, bone for surgery, need for additional surgery, complications with wound healing, continued pain, stiffness, , recurrence.  He voiced understanding of these risks and elected to proceed.  OPERATIVE COURSE:  After being identified preoperatively by myself,  the patient and I agreed on the procedure and site of the procedure.  The surgical site was marked.  Surgical consent had been signed. Preoperative IV antibiotic prophylaxis was given. He was transferred to the operating room and placed on the operating table in supine position with the Right upper extremity on an arm board.  Bier block anesthesia was induced by the anesthesiologist.  Right upper extremity was prepped and draped in  normal sterile orthopedic fashion.  A surgical pause was performed between the surgeons, anesthesia, and operating room staff and all were in agreement as to the patient, procedure, and site of procedure.  Tourniquet at the proximal aspect of the forearm had been inflated for the Bier block.  A hockey-stick shaped incision was made at the dorsum of the DIP joint of the right long finger.  This was carried into subcutaneous tissues by spreading technique.  The cyst was identified.  It was carefully freed up of surrounding soft tissues.  It was removed with the synovectomy rongeurs.  Was sent to pathology for examination.  It was coming from the ulnar side of the DIP joint.  The DIP joint was entered underneath the extensor tendon and debrided to remove any remaining portion of cyst as well as to perform synovectomy and remove prominent bone from the dorsum of the condyles of the middle phalanx.  This was done on both the radial and ulnar sides.  The Heberden's nodes were taken down some.  The wound and joint were copiously irrigated with sterile saline.  The wound was closed with 4-0 nylon in a horizontal mattress fashion.  A digital block had been performed with quarter percent plain Marcaine to aid in postoperative analgesia.  The wound was dressed with sterile Xeroform 4 x 4 and wrapped with a Coban dressing lightly.  An AlumaFoam splint was placed and wrapped lightly with Coban dressing.  The tourniquet was deflated at 27 minutes.  Fingertips were pink with brisk capillary refill after deflation of tourniquet.  The  operative  drapes were broken down.  The patient was awoken from anesthesia safely.  He was transferred back to the stretcher and taken to PACU in stable condition.  I will see him back in the office in 1 week for postoperative followup.  I will give him a prescription for Tramadol 50 mg 1-2 tabs PO q6 hours prn pain, dispense # 20.   Betha Loa, MD Electronically signed, 10/08/22

## 2022-10-08 NOTE — Anesthesia Procedure Notes (Signed)
Anesthesia Regional Block: Bier block (IV Regional)   Pre-Anesthetic Checklist: , timeout performed,  Correct Patient, Correct Site, Correct Laterality,  Correct Procedure,, site marked,  Surgical consent,  At surgeon's request  Laterality: Right         Needles:  Injection technique: Single-shot  Needle Type: Other      Needle Gauge: 20     Additional Needles:   Procedures:,,,,, intact distal pulses, Esmarch exsanguination,  Single tourniquet utilized    Narrative:   Performed by: Personally      

## 2022-10-08 NOTE — Anesthesia Procedure Notes (Signed)
Procedure Name: MAC Date/Time: 10/08/2022 1:24 PM  Performed by: Sheryn Bison, CRNAPre-anesthesia Checklist: Patient identified, Emergency Drugs available, Suction available, Patient being monitored and Timeout performed Patient Re-evaluated:Patient Re-evaluated prior to induction Oxygen Delivery Method: Simple face mask
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
## 2022-10-08 NOTE — Anesthesia Postprocedure Evaluation (Signed)
Anesthesia Post Note  Patient: Brendan Wagner  Procedure(s) Performed: RIGHT LONG FINGER EXCISION MUCOID CYST AND DEBRIDEMENT DISTAL INTERPHALANGEAL JOINT (Right: Middle Finger)     Patient location during evaluation: PACU Anesthesia Type: MAC Level of consciousness: awake and alert Pain management: pain level controlled Vital Signs Assessment: post-procedure vital signs reviewed and stable Respiratory status: spontaneous breathing, nonlabored ventilation and respiratory function stable Cardiovascular status: stable and blood pressure returned to baseline Postop Assessment: no apparent nausea or vomiting Anesthetic complications: no  No notable events documented.  Last Vitals:  Vitals:   10/08/22 1415 10/08/22 1428  BP: 102/65 100/61  Pulse: (!) 46 (!) 49  Resp: 17   Temp:  (!) 36.2 C  SpO2: 99% 97%    Last Pain:  Vitals:   10/08/22 1428  TempSrc: Oral  PainSc: 0-No pain                 Jahnai Slingerland,W. EDMOND

## 2022-10-08 NOTE — Anesthesia Preprocedure Evaluation (Addendum)
Anesthesia Evaluation  Patient identified by MRN, date of birth, ID band Patient awake    Reviewed: Allergy & Precautions, H&P , NPO status , Patient's Chart, lab work & pertinent test results  Airway Mallampati: II  TM Distance: >3 FB Neck ROM: Full    Dental no notable dental hx. (+) Teeth Intact, Poor Dentition, Dental Advisory Given   Pulmonary neg pulmonary ROS   Pulmonary exam normal breath sounds clear to auscultation       Cardiovascular Exercise Tolerance: Good hypertension, Pt. on medications Normal cardiovascular exam Rhythm:Regular Rate:Normal     Neuro/Psych  PSYCHIATRIC DISORDERS     Dementia CVA negative neurological ROS  negative psych ROS   GI/Hepatic negative GI ROS, Neg liver ROS,,,(+)     substance abuse  alcohol use  Endo/Other  negative endocrine ROSdiabetes, Type 2    Renal/GU negative Renal ROS  negative genitourinary   Musculoskeletal negative musculoskeletal ROS (+) Arthritis , Osteoarthritis,    Abdominal   Peds negative pediatric ROS (+)  Hematology negative hematology ROS (+)   Anesthesia Other Findings   Reproductive/Obstetrics negative OB ROS                             Anesthesia Physical Anesthesia Plan  ASA: 3  Anesthesia Plan: MAC and Bier Block and Bier Block-Lidocaine Only   Post-op Pain Management: Tylenol PO (pre-op)* and Celebrex PO (pre-op)*   Induction: Intravenous  PONV Risk Score and Plan: 2 and Ondansetron and Dexamethasone  Airway Management Planned: Natural Airway, Simple Face Mask and Mask  Additional Equipment: None  Intra-op Plan:   Post-operative Plan:   Informed Consent: I have reviewed the patients History and Physical, chart, labs and discussed the procedure including the risks, benefits and alternatives for the proposed anesthesia with the patient or authorized representative who has indicated his/her understanding and  acceptance.       Plan Discussed with: CRNA and Anesthesiologist  Anesthesia Plan Comments: Brendan Wagner is a very pleasant 67 y.o. RH male with a history of hypertension, hyperlipidemia, diabetes, anxiety, depression, alcohol habituation, seen today in follow up for memory loss.  MRI of the brain on 01/10/2022 showed advanced chronic small vessel ischemic changes, no age advanced parenchymal atrophy.)        Anesthesia Quick Evaluation

## 2022-10-08 NOTE — Transfer of Care (Signed)
Immediate Anesthesia Transfer of Care Note  Patient: Brendan Wagner  Procedure(s) Performed: RIGHT LONG FINGER EXCISION MUCOID CYST AND DEBRIDEMENT DISTAL INTERPHALANGEAL JOINT (Right: Middle Finger)  Patient Location: PACU  Anesthesia Type:Bier block  Level of Consciousness: drowsy and patient cooperative  Airway & Oxygen Therapy: Patient Spontanous Breathing and Patient connected to face mask oxygen  Post-op Assessment: Report given to RN and Post -op Vital signs reviewed and stable  Post vital signs: Reviewed and stable  Last Vitals:  Vitals Value Taken Time  BP    Temp    Pulse 57 10/08/22 1346  Resp 25 10/08/22 1346  SpO2 99 % 10/08/22 1346  Vitals shown include unvalidated device data.  Last Pain:  Vitals:   10/08/22 1121  TempSrc: Oral  PainSc: 0-No pain      Patients Stated Pain Goal: 3 (10/08/22 1121)  Complications: No notable events documented.

## 2022-10-09 ENCOUNTER — Encounter (HOSPITAL_BASED_OUTPATIENT_CLINIC_OR_DEPARTMENT_OTHER): Payer: Self-pay | Admitting: Orthopedic Surgery

## 2022-10-09 LAB — SURGICAL PATHOLOGY

## 2023-01-27 ENCOUNTER — Ambulatory Visit: Payer: Medicare HMO | Admitting: Physician Assistant

## 2023-01-27 ENCOUNTER — Encounter: Payer: Self-pay | Admitting: Physician Assistant

## 2023-01-27 VITALS — BP 133/75 | HR 71 | Resp 18 | Wt 176.0 lb

## 2023-01-27 DIAGNOSIS — F03A0 Unspecified dementia, mild, without behavioral disturbance, psychotic disturbance, mood disturbance, and anxiety: Secondary | ICD-10-CM
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
## 2023-02-10 ENCOUNTER — Telehealth: Payer: Self-pay | Admitting: Physician Assistant

## 2023-02-10 NOTE — Telephone Encounter (Signed)
Callers wife stated Brendan Wagner needs new medication prescribed.

## 2023-02-12 NOTE — Telephone Encounter (Signed)
Pls let her know Huntley Dec is out of the office and will be back Monday to answer question. I reviewed note and do not see medication mentioned aside from Memantine. Thanks

## 2023-02-12 NOTE — Telephone Encounter (Signed)
Wife called this morning stating that the last visit ( two weeks ago) that Provider Gwynneth Munson stated that she was going to prescribe the patient a new medication , Patients wife has called multiple times to get the medication, but she does not know the medication name. Patients wife would like a call back as well.

## 2023-07-11 ENCOUNTER — Encounter: Payer: Self-pay | Admitting: Psychology

## 2023-07-14 ENCOUNTER — Ambulatory Visit: Payer: Medicare Other | Admitting: Psychology

## 2023-07-14 ENCOUNTER — Encounter: Payer: Self-pay | Admitting: Psychology

## 2023-07-14 DIAGNOSIS — G309 Alzheimer's disease, unspecified: Secondary | ICD-10-CM

## 2023-07-14 DIAGNOSIS — I679 Cerebrovascular disease, unspecified: Secondary | ICD-10-CM

## 2023-07-14 DIAGNOSIS — F0150 Vascular dementia without behavioral disturbance: Secondary | ICD-10-CM | POA: Diagnosis not present

## 2023-07-14 DIAGNOSIS — R4189 Other symptoms and signs involving cognitive functions and awareness: Secondary | ICD-10-CM

## 2023-07-14 DIAGNOSIS — F0280 Dementia in other diseases classified elsewhere without behavioral disturbance: Secondary | ICD-10-CM

## 2023-07-14 NOTE — Progress Notes (Signed)
   Psychometrician Note   Cognitive testing was administered to Brendan Wagner by Lonell Jude, B.S. (psychometrist) under the supervision of Dr. Arthea KYM Maryland, Ph.D., licensed psychologist on 07/14/2023. Brendan Wagner did not appear overtly distressed by the testing session per behavioral observation or responses across self-report questionnaires. Rest breaks were offered.    The battery of tests administered was selected by Dr. Arthea KYM Maryland, Ph.D. with consideration to Brendan Wagner's current level of functioning, the nature of his symptoms, emotional and behavioral responses during interview, level of literacy, observed level of motivation/effort, and the nature of the referral question. This battery was communicated to the psychometrist. Communication between Dr. Arthea KYM Maryland, Ph.D. and the psychometrist was ongoing throughout the evaluation and Dr. Arthea KYM Maryland, Ph.D. was immediately accessible at all times. Dr. Arthea KYM Maryland, Ph.D. provided supervision to the psychometrist on the date of this service to the extent necessary to assure the quality of all services provided.    Brendan Wagner will return within approximately 1-2 weeks for an interactive feedback session with Dr. Maryland at which time his test performances, clinical impressions, and treatment recommendations will be reviewed in detail. Brendan Wagner understands he can contact our office should he require our assistance before this time.  A total of 125 minutes of billable time were spent face-to-face with Brendan Wagner by the psychometrist. This includes both test administration and scoring time. Billing for these services is reflected in the clinical report generated by Dr. Arthea KYM Maryland, Ph.D.  This note reflects time spent with the psychometrician and does not include test scores or any clinical interpretations made by Dr. Maryland. The full report will follow in a separate note.

## 2023-07-14 NOTE — Progress Notes (Signed)
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 Phonemic fluency was below average to average, semantic fluency was well below average to below average, and confrontation naming was exceptionally low.     Assessed visuospatial/visuoconstructional abilities were variable, ranging from the exceptionally low to above average normative range. Across his drawing of a clock, numbers were placed both inside and outside of the clock face, the numbers 11 and 12 were included twice, and the hands were placed in incorrect locations.    Learning (i.e., encoding) of novel verbal and visual information was exceptionally low to well below average. Spontaneous delayed recall (i.e., retrieval) of previously learned information was also exceptionally low to well below average. Retention rates were 0% across a list learning task, 0% across a story learning task, 0% across a daily living task, and 100% across a shape learning task. Performance across recognition tasks was exceptionally low, suggesting negligible evidence for information consolidation.   Results of emotional screening instruments suggested that recent symptoms of generalized anxiety were in the minimal range, while symptoms of depression  were within normal limits. A screening instrument assessing recent sleep quality suggested the presence of minimal sleep dysfunction.  Tables of Scores:   Note: This summary of test scores accompanies the interpretive report and should not be considered in isolation without reference to the appropriate sections in the text. Descriptors are based on appropriate normative data and may be adjusted based on clinical judgment. Terms such as Within Normal Limits and Outside Normal Limits are used when a more specific description of the test score cannot be determined. Descriptors refer to the current evaluation only.        Percentile - Normative Descriptor > 98 - Exceptionally High 91-97 - Well Above Average 75-90 - Above Average 25-74 - Average 9-24 - Below Average 2-8 - Well Below Average < 2 - Exceptionally Low        Validity: November 2024 Current  DESCRIPTOR        DCT: --- --- --- Within Normal Limits  NAB EVI: --- --- --- Within Normal Limits        Orientation:       Raw Score Raw Score Percentile   NAB Orientation, Form 1 24/29 23/29 --- ---        Cognitive Screening:       Raw Score Raw Score Percentile   SLUMS: 15/30 9/30 --- ---        Intellectual Functioning:       Standard Score Standard Score Percentile   Test of Premorbid Functioning: 87 91 27 Average        Memory:      NAB Memory Module, Form 1: Standard Score/ T Score Standard Score/ T Score Percentile   Total Memory Index 50 50 <1 Exceptionally Low  List Learning        Total Trials 1-3 10/36 (21)  9/36 (19) <1 Exceptionally Low    List B 2/12 (33) 1/12 (25) 1 Exceptionally Low    Short Delay Free Recall 0/12 (19) 0/12 (19) <1 Exceptionally Low    Long Delay Free Recall 0/12 (21) 0/12 (21) <1 Exceptionally Low    Retention Percentage 0 (<19) 0 (<19) <1 Exceptionally Low    Recognition Discriminability -3 (20) -4 (<19) <1 Exceptionally Low  Shape Learning        Total Trials 1-3 9/27 (29) 9/27 (29) 2  Well Below Average    Delayed Recall 2/9 (23) 3/9 (31) 3 Well Below Average    Retention Percentage 50 (35) 100 (51) 54 Average    Recognition Discriminability 1 (22) 2 (27) 1 Exceptionally Low  Story Learning        Immediate Recall 35/80 (29) 17/80 (19) <1 Exceptionally Low    Delayed Recall 0/40 (27) 0/40 (27) 1 Exceptionally Low    Retention Percentage 0 (<19) 0 (<19) <1 Exceptionally Low  Daily Living Memory        Immediate Recall 15/51 (19) 16/51 (19) <1 Exceptionally Low    Delayed Recall 0/17 (19) 0/17 (19) <1 Exceptionally Low    Retention Percentage 0 (<19) 0 (<19) <1 Exceptionally Low    Recognition Hits 3/10 (<19) 2/10 (<19) <1 Exceptionally Low        Attention/Executive Function:      Trail Making Test (TMT): Raw Score (T Score) Raw Score (T Score) Percentile     Part A 37 secs.,  0 errors (46) 80 secs.,  0 errors (23) <1 Exceptionally Low    Part B 152 secs.,  2 errors (35) 257 secs.,  1 error (26) 1 Exceptionally Low          Scaled Score Scaled Score Percentile   WAIS-IV Coding: 7 6 9  Below Average        NAB Attention Module, Form 1: T Score T Score Percentile     Digits Forward 36 39 14 Below Average    Digits Backwards 45 32 4 Well Below Average         Scaled Score Scaled Score Percentile   WAIS-IV Similarities: 7 10 50 Average        D-KEFS Color-Word Interference Test: Raw Score (Scaled Score) Raw Score (Scaled Score) Percentile     Color Naming 29 secs. (12) 35 secs. (9) 37 Average    Word Reading 20 secs. (12) 19 secs. (13) 84 Above Average    Inhibition 89 secs. (6) 93 secs. (6) 9 Below Average      Total Errors 7 errors (4) 3 errors (9) 37 Average    Inhibition/Switching 144 secs. (1) 115 secs. (5) 5 Well Below Average      Total Errors 5 errors (8) 15 errors (1) <1 Exceptionally Low        D-KEFS Verbal Fluency Test: Raw Score (Scaled Score) Raw Score (Scaled Score) Percentile     Letter Total Correct 35 (10) 32 (9) 37 Average    Category  Total Correct 25 (6) 25 (6) 9 Below Average    Category Switching Total Correct 3 (1) 4 (1) <1 Exceptionally Low    Category Switching Accuracy 0 (1) 3 (2) <1 Exceptionally Low      Total Set Loss Errors 7 (4) 5 (6) 9 Below Average      Total Repetition Errors 7 (6) 9 (4) 2 Well Below Average        Language:  Verbal Fluency Test: Raw Score (T Score) Raw Score (T Score) Percentile     Phonemic Fluency (FAS) 35 (44) 32 (41) 18 Below Average    Animal Fluency 14 (37) 12 (33) 5 Well Below Average         NAB Language Module, Form 1: T Score T Score Percentile     Auditory Comprehension 36 36 8 Well Below Average    Naming 29/31 (41) 26/31 (24) <1 Exceptionally Low        Visuospatial/Visuoconstruction:       Raw Score Raw Score Percentile   Clock Drawing: 5/10 5/10 --- Impaired        NAB Spatial Module, Form 1: T Score T Score Percentile     Figure Drawing Copy 56 60 84 Above Average         Scaled Score Scaled Score Percentile   WAIS-IV Block Design: 8 5 5  Well Below Average        Mood and Personality:       Raw Score Raw Score Percentile   Beck Depression Inventory - II: 4 2 --- Within Normal Limits  PROMIS Anxiety Questionnaire: 16 7 --- None to Slight        Additional Questionnaires:       Raw Score Raw Score Percentile   PROMIS Sleep Disturbance Questionnaire: 16 13 --- None to Slight   Informed Consent and Coding/Compliance:   The current evaluation represents a clinical evaluation for the purposes previously outlined by the referral source and is in no way reflective of a forensic evaluation.   Mr. Kielbasa was provided with a verbal description of the nature and purpose of the present neuropsychological evaluation. Also reviewed were the foreseeable risks and/or discomforts and benefits of the procedure, limits of confidentiality, and mandatory reporting requirements of this provider. The patient was given the opportunity to ask questions and receive answers about  the evaluation. Oral consent to participate was provided by the patient.   This evaluation was conducted by Arthea KYM Maryland, Ph.D., ABPP-CN, board certified clinical neuropsychologist. Mr. Gravlin completed a clinical interview with Dr. Maryland, billed as one unit [phone], and 125 minutes of cognitive testing and scoring, billed as one unit [phone] and three additional units [id number]. Psychometrist Lonell Jude, B.S. assisted Dr. Maryland with test administration and scoring procedures. As a separate and discrete service, one unit [id number] and two units [phone] were billed for Dr. Loralee time spent in interpretation and report writing.

## 2023-07-16 DIAGNOSIS — H2511 Age-related nuclear cataract, right eye: Secondary | ICD-10-CM | POA: Diagnosis not present

## 2023-07-16 DIAGNOSIS — H25812 Combined forms of age-related cataract, left eye: Secondary | ICD-10-CM | POA: Diagnosis not present

## 2023-07-21 ENCOUNTER — Ambulatory Visit: Payer: Medicare Other | Admitting: Psychology

## 2023-07-21 DIAGNOSIS — F0280 Dementia in other diseases classified elsewhere without behavioral disturbance: Secondary | ICD-10-CM

## 2023-07-21 DIAGNOSIS — G309 Alzheimer's disease, unspecified: Secondary | ICD-10-CM | POA: Diagnosis not present

## 2023-07-21 DIAGNOSIS — F0150 Vascular dementia without behavioral disturbance: Secondary | ICD-10-CM

## 2023-07-21 DIAGNOSIS — F1011 Alcohol abuse, in remission: Secondary | ICD-10-CM

## 2023-07-21 DIAGNOSIS — I679 Cerebrovascular disease, unspecified: Secondary | ICD-10-CM

## 2023-07-21 NOTE — Progress Notes (Signed)
   Neuropsychology Feedback Session Brendan Wagner. [facility] [place] Department of Neurology  Reason for Referral:   Brendan Wagner is a 68 y.o. right-handed Caucasian male referred by Marlowe Kays, PA-C, to characterize his current cognitive functioning and assist with diagnostic clarity and treatment planning in the context of a prior mixed dementia presentation, ongoing alcohol abuse (since in complete remission), and concern for progressive cognitive decline.   Feedback:   Brendan Wagner completed a comprehensive neuropsychological evaluation on 07/14/2023. Please refer to that encounter for the full report and recommendations. Briefly, results suggested severe impairment surrounding confrontation naming and all aspects of learning and memory. Weaknesses were further exhibited across attention/concentration and semantic fluency. Variability was exhibited across processing speed, executive functioning, and visuospatial abilities. Relative to his previous evaluation in January 2024, mild declines were exhibited across processing speed, complex attention, and confrontation naming. Mild decline was also exhibited across executive functioning; however, this was more variable than consistent. Severe memory impairment with very poor retention was exhibited across both evaluations. Other assessed cognitive domains exhibited stability. I continue to feel that a mixed dementia presentation best characteristics Brendan Wagner's clinical presentation. Concerns for underlying Alzheimer's disease remain. Outside of these concerns, there remains likely contributions from remote but chronic alcohol abuse/dependence. There are also likely contributions from prior neuroimaging suggested advanced microvascular ischemic disease and numerous cardiovascular and other chronic medical ailments. The combination of these factors with concerns for underlying Alzheimer's disease would manifest in a mixed dementia presentation.    Brendan Wagner was accompanied by his wife during the current feedback session. Content of the current session focused on the results of his neuropsychological evaluation. Brendan Wagner was given the opportunity to ask questions and his questions were answered. He was encouraged to reach out should additional questions arise. A copy of his report was provided at the conclusion of the visit.      One unit [phone] was billed for Dr. Tammy Sours time spent preparing for, conducting, and documenting the current feedback session with Brendan Wagner.

## 2023-07-23 DIAGNOSIS — H2511 Age-related nuclear cataract, right eye: Secondary | ICD-10-CM | POA: Diagnosis not present
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
## 2023-08-04 ENCOUNTER — Ambulatory Visit: Payer: Medicare Other | Admitting: Physician Assistant

## 2023-08-04 VITALS — BP 134/63 | HR 82 | Resp 18 | Ht 71.0 in | Wt 176.0 lb

## 2023-08-04 DIAGNOSIS — F0150 Vascular dementia without behavioral disturbance: Secondary | ICD-10-CM | POA: Diagnosis not present

## 2023-08-04 DIAGNOSIS — F0280 Dementia in other diseases classified elsewhere without behavioral disturbance: Secondary | ICD-10-CM

## 2023-08-04 DIAGNOSIS — G309 Alzheimer's disease, unspecified: Secondary | ICD-10-CM

## 2023-08-04 MED ORDER — MEMANTINE HCL 10 MG PO TABS: ORAL_TABLET | ORAL | 3 refills | Status: DC

## 2023-08-04 MED ORDER — DONEPEZIL HCL 10 MG PO TABS: ORAL_TABLET | ORAL | 11 refills | Status: DC

## 2023-08-04 NOTE — Patient Instructions (Signed)
It was a pleasure to see you today at our office.   Recommendations:    Follow up in  6 months Continue Memantine 10 mg twice daily Start Donepezil 10 mg :Take half tablet (5 mg) daily for 2 weeks, then increase to the full tablet at 10 mg daily.   Recommend visiting the website : " Dementia Success Path" to better understand some behaviors related to memory loss.    For psychiatric meds, mood meds: Please have your primary care physician manage these medications.  If you have any severe symptoms of a stroke, or other severe issues such as confusion,severe chills or fever, etc call 911 or go to the ER as you may need to be evaluated further    For assessment of decision of mental capacity and competency:  Call Dr. Erick Blinks, geriatric psychiatrist at [phone]  Counseling regarding caregiver distress, including caregiver depression, anxiety and issues regarding community resources, adult day care programs, adult living facilities, or memory care questions:  please contact your  Primary Doctor's Social Worker   Whom to call: Memory  decline, memory medications: Call our office [phone]    [email or web address]   RECOMMENDATIONS FOR ALL PATIENTS WITH MEMORY PROBLEMS: 1. Continue to exercise (Recommend 30 minutes of walking everyday, or 3 hours every week) 2. Increase social interactions - continue going to [place] and enjoy social gatherings with friends and family 3. Eat healthy, avoid fried foods and eat more fruits and vegetables 4. Maintain adequate blood pressure, blood sugar, and blood cholesterol level. Reducing the risk of stroke and cardiovascular disease also helps promoting better memory. 5. Avoid stressful situations. Live a simple life and avoid aggravations. Organize your time and prepare for the next day in anticipation. 6. Sleep well, avoid any interruptions of sleep and avoid any distractions in the bedroom  that may interfere with adequate sleep quality 7. Avoid sugar, avoid sweets as there is a strong link between excessive sugar intake, diabetes, and cognitive impairment We discussed the Mediterranean diet, which has been shown to help patients reduce the risk of progressive memory disorders and reduces cardiovascular risk. This includes eating fish, eat fruits and green leafy vegetables, nuts like almonds and hazelnuts, walnuts, and also use olive oil. Avoid fast foods and fried foods as much as possible. Avoid sweets and sugar as sugar use has been linked to worsening of memory function.  There is always a concern of gradual progression of memory problems. If this is the case, then we may need to adjust level of care according to patient needs. Support, both to the patient and caregiver, should then be put into place.          DRIVING: Regarding driving, in patients with progressive memory problems, driving will be impaired. We advise to have someone else do the driving if trouble finding directions or if minor accidents are reported. Independent driving assessment is available to determine safety of driving.   If you are interested in the driving assessment, you can contact the following:  [employer] in [place] [phone]  [facility] [phone]  [facility] [phone]  [facility] [phone] or [phone]   FALL PRECAUTIONS: Be cautious when walking. Scan the area for obstacles that may increase the risk of trips and falls. When getting up in the mornings, sit up at the edge of the bed for a few minutes before getting out of bed. Consider elevating the bed at the head end to avoid drop of blood  pressure when getting up. Walk always in a well-lit room (use night lights in the walls). Avoid area rugs or power cords from appliances in the middle of the walkways. Use a walker or a cane if necessary and consider physical therapy for  balance exercise. Get your eyesight checked regularly.  FINANCIAL OVERSIGHT: Supervision, especially oversight when making financial decisions or transactions is also recommended.  HOME SAFETY: Consider the safety of the kitchen when operating appliances like stoves, microwave oven, and blender. Consider having supervision and share cooking responsibilities until no longer able to participate in those. Accidents with firearms and other hazards in the house should be identified and addressed as well.   ABILITY TO BE LEFT ALONE: If patient is unable to contact 911 operator, consider using LifeLine, or when the need is there, arrange for someone to stay with patients. Smoking is a fire hazard, consider supervision or cessation. Risk of wandering should be assessed by caregiver and if detected at any point, supervision and safe proof recommendations should be instituted.  MEDICATION SUPERVISION: Inability to self-administer medication needs to be constantly addressed. Implement a mechanism to ensure safe administration of the medications.      Mediterranean Diet A Mediterranean diet refers to food and lifestyle choices that are based on the traditions of countries located on the [employer] [place]. This way of eating has been shown to help prevent certain conditions and improve outcomes for people who have chronic diseases, like kidney disease and heart disease. What are tips for following this plan? Lifestyle  Cook and eat meals together with your family, when possible. Drink enough fluid to keep your urine clear or pale yellow. Be physically active every day. This includes: Aerobic exercise like running or swimming. Leisure activities like gardening, walking, or housework. Get 7-8 hours of sleep each night. If recommended by your health care provider, drink red wine in moderation. This means 1 glass a day for nonpregnant women and 2 glasses a day for men. A glass of wine equals 5 oz (150  mL). Reading food labels  Check the serving size of packaged foods. For foods such as rice and pasta, the serving size refers to the amount of cooked product, not dry. Check the total fat in packaged foods. Avoid foods that have saturated fat or trans fats. Check the ingredients list for added sugars, such as corn syrup. Shopping  At the grocery store, buy most of your food from the areas near the walls of the store. This includes: Fresh fruits and vegetables (produce). Grains, beans, nuts, and seeds. Some of these may be available in unpackaged forms or large amounts (in bulk). Fresh seafood. Poultry and eggs. Low-fat dairy products. Buy whole ingredients instead of prepackaged foods. Buy fresh fruits and vegetables in-season from local farmers markets. Buy frozen fruits and vegetables in resealable bags. If you do not have access to quality fresh seafood, buy precooked frozen shrimp or canned fish, such as tuna, salmon, or sardines. Buy small amounts of raw or cooked vegetables, salads, or olives from the deli or salad bar at your store. Stock your pantry so you always have certain foods on hand, such as olive oil, canned tuna, canned tomatoes, rice, pasta, and beans. Cooking  Cook foods with extra-virgin olive oil instead of using butter or other vegetable oils. Have meat as a side dish, and have vegetables or grains as your main dish. This means having meat in small portions or adding small amounts of meat to foods like pasta  or stew. Use beans or vegetables instead of meat in common dishes like chili or lasagna. Experiment with different cooking methods. Try roasting or broiling vegetables instead of steaming or sauteing them. Add frozen vegetables to soups, stews, pasta, or rice. Add nuts or seeds for added healthy fat at each meal. You can add these to yogurt, salads, or vegetable dishes. Marinate fish or vegetables using olive oil, lemon juice, garlic, and fresh herbs. Meal  planning  Plan to eat 1 vegetarian meal one day each week. Try to work up to 2 vegetarian meals, if possible. Eat seafood 2 or more times a week. Have healthy snacks readily available, such as: Vegetable sticks with hummus. Greek yogurt. Fruit and nut trail mix. Eat balanced meals throughout the week. This includes: Fruit: 2-3 servings a day Vegetables: 4-5 servings a day Low-fat dairy: 2 servings a day Fish, poultry, or lean meat: 1 serving a day Beans and legumes: 2 or more servings a week Nuts and seeds: 1-2 servings a day Whole grains: 6-8 servings a day Extra-virgin olive oil: 3-4 servings a day Limit red meat and sweets to only a few servings a month What are my food choices? Mediterranean diet Recommended Grains: Whole-grain pasta. Brown rice. Bulgar wheat. Polenta. Couscous. Whole-wheat bread. Orpah Cobb. Vegetables: Artichokes. Beets. Broccoli. Cabbage. Carrots. Eggplant. Green beans. Chard. Kale. Spinach. Onions. Leeks. Peas. Squash. Tomatoes. Peppers. Radishes. Fruits: Apples. Apricots. Avocado. Berries. Bananas. Cherries. Dates. Figs. Grapes. Lemons. Melon. Oranges. Peaches. Plums. Pomegranate. Meats and other protein foods: Beans. Almonds. Sunflower seeds. Pine nuts. Peanuts. Cod. Salmon. Scallops. Shrimp. Tuna. Tilapia. Clams. Oysters. Eggs. Dairy: Low-fat milk. Cheese. Greek yogurt. Beverages: Water. Red wine. Herbal tea. Fats and oils: Extra virgin olive oil. Avocado oil. Grape seed oil. Sweets and desserts: [place] yogurt with honey. Baked apples. Poached pears. Trail mix. Seasoning and other foods: Basil. Cilantro. Coriander. Cumin. Mint. Parsley. Sage. Rosemary. Tarragon. Garlic. Oregano. Thyme. Pepper. Balsalmic vinegar. Tahini. Hummus. Tomato sauce. Olives. Mushrooms. Limit these Grains: Prepackaged pasta or rice dishes. Prepackaged cereal with added sugar. Vegetables: Deep fried potatoes (french fries). Fruits: Fruit canned in syrup. Meats and other protein  foods: Beef. Pork. Lamb. Poultry with skin. Hot dogs. Tomasa Blase. Dairy: Ice cream. Sour cream. Whole milk. Beverages: Juice. Sugar-sweetened soft drinks. Beer. Liquor and spirits. Fats and oils: Butter. Canola oil. Vegetable oil. Beef fat (tallow). Lard. Sweets and desserts: Cookies. Cakes. Pies. Candy. Seasoning and other foods: Mayonnaise. Premade sauces and marinades. The items listed may not be a complete list. Talk with your dietitian about what dietary choices are right for you. Summary The Mediterranean diet includes both food and lifestyle choices. Eat a variety of fresh fruits and vegetables, beans, nuts, seeds, and whole grains. Limit the amount of red meat and sweets that you eat. Talk with your health care provider about whether it is safe for you to drink red wine in moderation. This means 1 glass a day for nonpregnant women and 2 glasses a day for men. A glass of wine equals 5 oz (150 mL). This information is not intended to replace advice given to you by your health care provider. Make sure you discuss any questions you have with your health care provider. Document Released: 02/08/2016 Document Revised: 03/12/2016 Document Reviewed: 02/08/2016 Elsevier Interactive Patient Education  2017 [employer].

## 2023-08-18 DIAGNOSIS — Z0000 Encounter for general adult medical examination without abnormal findings: Secondary | ICD-10-CM | POA: Diagnosis not present

## 2023-08-18 DIAGNOSIS — I10 Essential (primary) hypertension: Secondary | ICD-10-CM | POA: Diagnosis not present

## 2023-08-18 DIAGNOSIS — M545 Low back pain, unspecified: Secondary | ICD-10-CM | POA: Diagnosis not present

## 2023-08-18 DIAGNOSIS — E785 Hyperlipidemia, unspecified: Secondary | ICD-10-CM | POA: Diagnosis not present

## 2023-08-19 DIAGNOSIS — F03A0 Unspecified dementia, mild, without behavioral disturbance, psychotic disturbance, mood disturbance, and anxiety: Secondary | ICD-10-CM | POA: Diagnosis not present

## 2023-08-19 DIAGNOSIS — Z0000 Encounter for general adult medical examination without abnormal findings: Secondary | ICD-10-CM | POA: Diagnosis not present

## 2023-08-19 DIAGNOSIS — Z125 Encounter for screening for malignant neoplasm of prostate: Secondary | ICD-10-CM | POA: Diagnosis not present

## 2023-09-03 DIAGNOSIS — Z961 Presence of intraocular lens: Secondary | ICD-10-CM | POA: Diagnosis not present

## 2023-09-03 DIAGNOSIS — H40013 Open angle with borderline findings, low risk, bilateral: Secondary | ICD-10-CM | POA: Diagnosis not present

## 2023-09-15 DIAGNOSIS — M19012 Primary osteoarthritis, left shoulder: Secondary | ICD-10-CM | POA: Diagnosis not present

## 2023-09-16 ENCOUNTER — Other Ambulatory Visit: Payer: Self-pay | Admitting: Physician Assistant

## 2023-09-26 DIAGNOSIS — Z961 Presence of intraocular lens: Secondary | ICD-10-CM | POA: Diagnosis not present

## 2023-09-26 DIAGNOSIS — H02816 Retained foreign body in left eye, unspecified eyelid: Secondary | ICD-10-CM | POA: Diagnosis not present

## 2023-12-19 ENCOUNTER — Ambulatory Visit: Admitting: Physician Assistant

## 2023-12-19 ENCOUNTER — Encounter: Payer: Self-pay | Admitting: Physician Assistant

## 2023-12-19 VITALS — BP 114/59 | HR 74 | Ht 71.0 in | Wt 161.0 lb

## 2023-12-19 DIAGNOSIS — G309 Alzheimer's disease, unspecified: Secondary | ICD-10-CM

## 2023-12-19 DIAGNOSIS — F0280 Dementia in other diseases classified elsewhere without behavioral disturbance: Secondary | ICD-10-CM

## 2023-12-19 DIAGNOSIS — F0150 Vascular dementia without behavioral disturbance: Secondary | ICD-10-CM | POA: Diagnosis not present

## 2023-12-19 MED ORDER — DONEPEZIL HCL 10 MG PO TABS: 10.0000 mg | ORAL_TABLET | Freq: Every day | ORAL | 3 refills | Status: DC

## 2023-12-19 MED ORDER — ESCITALOPRAM OXALATE 5 MG PO TABS: ORAL_TABLET | ORAL | 11 refills | Status: DC

## 2023-12-19 MED ORDER — MEMANTINE HCL 10 MG PO TABS: 10.0000 mg | ORAL_TABLET | Freq: Two times a day (BID) | ORAL | 3 refills | Status: DC

## 2023-12-19 NOTE — Patient Instructions (Addendum)
 It was a pleasure to see you today at our office.   Recommendations:    Follow up in  6 months Continue Memantine  10 mg twice daily Start Donepezil  10 mg daily.   Start Lexapro 5 mg daily for mood  Recommend visiting the website :  Dementia Success Path to better understand some behaviors related to memory loss.      Whom to call: Memory  decline, memory medications: Call our office [phone]    [email or web address]   RECOMMENDATIONS FOR ALL PATIENTS WITH MEMORY PROBLEMS: 1. Continue to exercise (Recommend 30 minutes of walking everyday, or 3 hours every week) 2. Increase social interactions - continue going to [place] and enjoy social gatherings with friends and family 3. Eat healthy, avoid fried foods and eat more fruits and vegetables 4. Maintain adequate blood pressure, blood sugar, and blood cholesterol level. Reducing the risk of stroke and cardiovascular disease also helps promoting better memory. 5. Avoid stressful situations. Live a simple life and avoid aggravations. Organize your time and prepare for the next day in anticipation. 6. Sleep well, avoid any interruptions of sleep and avoid any distractions in the bedroom that may interfere with adequate sleep quality 7. Avoid sugar, avoid sweets as there is a strong link between excessive sugar intake, diabetes, and cognitive impairment We discussed the Mediterranean diet, which has been shown to help patients reduce the risk of progressive memory disorders and reduces cardiovascular risk. This includes eating fish, eat fruits and green leafy vegetables, nuts like almonds and hazelnuts, walnuts, and also use olive oil. Avoid fast foods and fried foods as much as possible. Avoid sweets and sugar as sugar use has been linked to worsening of memory function.  There is always a concern of gradual progression of memory problems. If this is the case, then we may need to adjust  level of care according to patient needs. Support, both to the patient and caregiver, should then be put into place.          DRIVING: Regarding driving, in patients with progressive memory problems, driving will be impaired. We advise to have someone else do the driving if trouble finding directions or if minor accidents are reported. Independent driving assessment is available to determine safety of driving.   If you are interested in the driving assessment, you can contact the following:  [employer] in [place] [phone]  [facility] [phone]  [facility] [phone]  [facility] [phone] or [phone]   FALL PRECAUTIONS: Be cautious when walking. Scan the area for obstacles that may increase the risk of trips and falls. When getting up in the mornings, sit up at the edge of the bed for a few minutes before getting out of bed. Consider elevating the bed at the head end to avoid drop of blood pressure when getting up. Walk always in a well-lit room (use night lights in the walls). Avoid area rugs or power cords from appliances in the middle of the walkways. Use a walker or a cane if necessary and consider physical therapy for balance exercise. Get your eyesight checked regularly.  FINANCIAL OVERSIGHT: Supervision, especially oversight when making financial decisions or transactions is also recommended.  HOME SAFETY: Consider the safety of the kitchen when operating appliances like stoves, microwave oven, and blender. Consider having supervision and share cooking responsibilities until no longer able to participate in those. Accidents with firearms and other hazards in the house should be identified and addressed as well.  ABILITY TO BE LEFT ALONE: If patient is unable to contact 911 operator, consider using LifeLine, or when the need is there, arrange for someone to stay with patients. Smoking is a fire hazard, consider  supervision or cessation. Risk of wandering should be assessed by caregiver and if detected at any point, supervision and safe proof recommendations should be instituted.  MEDICATION SUPERVISION: Inability to self-administer medication needs to be constantly addressed. Implement a mechanism to ensure safe administration of the medications.      Mediterranean Diet A Mediterranean diet refers to food and lifestyle choices that are based on the traditions of countries located on the [place]. This way of eating has been shown to help prevent certain conditions and improve outcomes for people who have chronic diseases, like kidney disease and heart disease. What are tips for following this plan? Lifestyle  Cook and eat meals together with your family, when possible. Drink enough fluid to keep your urine clear or pale yellow. Be physically active every day. This includes: Aerobic exercise like running or swimming. Leisure activities like gardening, walking, or housework. Get 7-8 hours of sleep each night. If recommended by your health care provider, drink red wine in moderation. This means 1 glass a day for nonpregnant women and 2 glasses a day for men. A glass of wine equals 5 oz (150 mL). Reading food labels  Check the serving size of packaged foods. For foods such as rice and pasta, the serving size refers to the amount of cooked product, not dry. Check the total fat in packaged foods. Avoid foods that have saturated fat or trans fats. Check the ingredients list for added sugars, such as corn syrup. Shopping  At the grocery store, buy most of your food from the areas near the walls of the store. This includes: Fresh fruits and vegetables (produce). Grains, beans, nuts, and seeds. Some of these may be available in unpackaged forms or large amounts (in bulk). Fresh seafood. Poultry and eggs. Low-fat dairy products. Buy whole ingredients instead of prepackaged foods. Buy fresh  fruits and vegetables in-season from local farmers markets. Buy frozen fruits and vegetables in resealable bags. If you do not have access to quality fresh seafood, buy precooked frozen shrimp or canned fish, such as tuna, salmon, or sardines. Buy small amounts of raw or cooked vegetables, salads, or olives from the deli or salad bar at your store. Stock your pantry so you always have certain foods on hand, such as olive oil, canned tuna, canned tomatoes, rice, pasta, and beans. Cooking  Cook foods with extra-virgin olive oil instead of using butter or other vegetable oils. Have meat as a side dish, and have vegetables or grains as your main dish. This means having meat in small portions or adding small amounts of meat to foods like pasta or stew. Use beans or vegetables instead of meat in common dishes like chili or lasagna. Experiment with different cooking methods. Try roasting or broiling vegetables instead of steaming or sauteing them. Add frozen vegetables to soups, stews, pasta, or rice. Add nuts or seeds for added healthy fat at each meal. You can add these to yogurt, salads, or vegetable dishes. Marinate fish or vegetables using olive oil, lemon juice, garlic, and fresh herbs. Meal planning  Plan to eat 1 vegetarian meal one day each week. Try to work up to 2 vegetarian meals, if possible. Eat seafood 2 or more times a week. Have healthy snacks readily available, such as: Vegetable sticks with  hummus. Greek yogurt. Fruit and nut trail mix. Eat balanced meals throughout the week. This includes: Fruit: 2-3 servings a day Vegetables: 4-5 servings a day Low-fat dairy: 2 servings a day Fish, poultry, or lean meat: 1 serving a day Beans and legumes: 2 or more servings a week Nuts and seeds: 1-2 servings a day Whole grains: 6-8 servings a day Extra-virgin olive oil: 3-4 servings a day Limit red meat and sweets to only a few servings a month What are my food choices? Mediterranean  diet Recommended Grains: Whole-grain pasta. Brown rice. Bulgar wheat. Polenta. Couscous. Whole-wheat bread. Dwyane Glad. Vegetables: Artichokes. Beets. Broccoli. Cabbage. Carrots. Eggplant. Green beans. Chard. Kale. Spinach. Onions. Leeks. Peas. Squash. Tomatoes. Peppers. Radishes. Fruits: Apples. Apricots. Avocado. Berries. Bananas. Cherries. Dates. Figs. Grapes. Lemons. Melon. Oranges. Peaches. Plums. Pomegranate. Meats and other protein foods: Beans. Almonds. Sunflower seeds. Pine nuts. Peanuts. Cod. Salmon. Scallops. Shrimp. Tuna. Tilapia. Clams. Oysters. Eggs. Dairy: Low-fat milk. Cheese. Greek yogurt. Beverages: Water. Red wine. Herbal tea. Fats and oils: Extra virgin olive oil. Avocado oil. Grape seed oil. Sweets and desserts: [place] yogurt with honey. Baked apples. Poached pears. Trail mix. Seasoning and other foods: Basil. Cilantro. Coriander. Cumin. Mint. Parsley. Sage. Rosemary. Tarragon. Garlic. Oregano. Thyme. Pepper. Balsalmic vinegar. Tahini. Hummus. Tomato sauce. Olives. Mushrooms. Limit these Grains: Prepackaged pasta or rice dishes. Prepackaged cereal with added sugar. Vegetables: Deep fried potatoes (french fries). Fruits: Fruit canned in syrup. Meats and other protein foods: Beef. Pork. Lamb. Poultry with skin. Hot dogs. Helene Loader. Dairy: Ice cream. Sour cream. Whole milk. Beverages: Juice. Sugar-sweetened soft drinks. Beer. Liquor and spirits. Fats and oils: Butter. Canola oil. Vegetable oil. Beef fat (tallow). Lard. Sweets and desserts: Cookies. Cakes. Pies. Candy. Seasoning and other foods: Mayonnaise. Premade sauces and marinades. The items listed may not be a complete list. Talk with your dietitian about what dietary choices are right for you. Summary The Mediterranean diet includes both food and lifestyle choices. Eat a variety of fresh fruits and vegetables, beans, nuts, seeds, and whole grains. Limit the amount of red meat and sweets that you eat. Talk with your  health care provider about whether it is safe for you to drink red wine in moderation. This means 1 glass a day for nonpregnant women and 2 glasses a day for men. A glass of wine equals 5 oz (150 mL). This information is not intended to replace advice given to you by your health care provider. Make sure you discuss any questions you have with your health care provider. Document Released: 02/08/2016 Document Revised: 03/12/2016 Document Reviewed: 02/08/2016 Elsevier Interactive Patient Education  2017 [employer].
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
## 2023-12-24 DIAGNOSIS — K080 Exfoliation of teeth due to systemic causes: Secondary | ICD-10-CM | POA: Diagnosis not present

## 2023-12-29 DIAGNOSIS — K080 Exfoliation of teeth due to systemic causes: Secondary | ICD-10-CM | POA: Diagnosis not present

## 2024-01-07 DIAGNOSIS — H40013 Open angle with borderline findings, low risk, bilateral: Secondary | ICD-10-CM | POA: Diagnosis not present

## 2024-01-07 DIAGNOSIS — K080 Exfoliation of teeth due to systemic causes: Secondary | ICD-10-CM | POA: Diagnosis not present

## 2024-01-07 DIAGNOSIS — Z961 Presence of intraocular lens: Secondary | ICD-10-CM | POA: Diagnosis not present

## 2024-01-07 DIAGNOSIS — H1045 Other chronic allergic conjunctivitis: Secondary | ICD-10-CM | POA: Diagnosis not present

## 2024-02-02 ENCOUNTER — Ambulatory Visit: Payer: Medicare Other | Admitting: Physician Assistant

## 2024-02-13 NOTE — Progress Notes (Signed)
 "  [facility]  - Family Medicine Brendan Wagner  Date of Service: 02/16/2024 Patient Name: Brendan Wagner Patient DOB: 03-28-1956    Subjective:   Follow-up, Hyperlipidemia, and Hypertension Wife with pt  HPI Patient comes in for Follow-up, Hyperlipidemia, and Hypertension   Hypertension: He is physically active, is adherent to low salt diet. He is taking medications as directed. Home BP monitoring: No  Lab Results  Component Value Date   CREATININE 0.81 08/19/2023    Hyperlipemia: Here today for follow up of hyperlipidemia. He is taking medications as directed without side effects. Most recent labs show:  Lab Results  Component Value Date   HDL 60 08/19/2023   TRIG 95 08/19/2023   Lab Results  Component Value Date   LDLCALC 88 08/19/2023    Review of Systems Pertinent ROS items are noted in HPI.  Constitutional symptoms:  lost 14 pounds, constantly moving, drinking protein drinks supplements, eating ice cream. Wife states he does eat well but is always moving around, doing yard work, house work, etc Eyes:  negative Ear, nose, throat:  negative Cardiovascular:  denies chest pain or dyspnea, no edema, denies headaches or dizziness.  Respiratory:  negative Gastrointestinal:  denies abd pain, normal BMs.  Genitourinary:  denies problem with urination Skin:  negative Neurological:  on namenda  and aricept . Sees neuro every 6-12 months, now on lexapro  5 mg as well. Wifes state he is stable, pt states he has always had to write down things to remember.  Musculoskeletal: hx of back pain/ MRI 2021; DDD Psychiatric: constantly moving. Sleeping well. Drinks non alcoholic beer. Hx of alcohol in past.  Endocrine:  negative Hematological:  negative Allergic:  negative   The following portions of the patient's history were reviewed and updated as appropriate: allergies, current medications, PMH/PSH, past social history and problem list.   Past  Medical/Surgical History:   Medical History[1] Surgical History[2]  Family History:   Family History[3]  Social History:   Social History[4] Tobacco Use History[5]   Allergies:   Patient has no known allergies.  Current Medications:   Current Medications[6]   Objective:   Vital Signs BP 130/60 (BP Location: Left arm, Patient Position: Sitting)   Pulse (!) 49   Temp 97.6 F (36.4 C)   Ht 2.108 m (6' 11)   Wt 73.5 kg (162 lb 2 oz)   SpO2 96%   BMI 16.55 kg/m   BP Readings from Last 3 Encounters:  02/16/24 130/60  08/18/23 139/80  01/20/23 130/63   Wt Readings from Last 3 Encounters:  02/16/24 73.5 kg (162 lb 2 oz)  08/18/23 80.2 kg (176 lb 12.8 oz)  01/20/23 80.5 kg (177 lb 8 oz)   No LMP for male patient.  Physical Exam  Constitutional.  Well appearing 68 y.o. male, well developed, well nourished, no acute distress. Weight down 14 pounds since February Respiratory.  Clear bilaterally, breathsounds equal, respirations unlabored. Cardiovascular.  Regular, nl S1, S2; no murmurs, gallops or rubs.  No lower extremity edema, 2+ peripheral pulses.no carotid bruits bil Gastro: soft, non-tender Neuro: alert, oriented x 3, CN 2-12 intact bil, no neuro deficits. Romberg neg.  Skin: warm and dry Psych: cooperative, pleasant.   Assessment/Plan:    Brendan Wagner was seen today for follow-up, hyperlipidemia and hypertension.  Diagnoses and all orders for this visit:  Essential (primary) hypertension -     lisinopriL -hydroCHLOROthiazide  (PRINZIDE ) 20-12.5 mg per tablet; Take 1 tablet by mouth daily. -  Basic Metabolic Panel; Future -     Basic Metabolic Panel  Hyperlipidemia, unspecified hyperlipidemia type -     amLODIPine  (NORVASC ) 5 mg tablet; Take 1 tablet (5 mg total) by mouth daily.  Hyperlipidemia, unspecified -     rosuvastatin (CRESTOR) 5 mg tablet; Take 1 tablet (5 mg total) by mouth daily.  Mild dementia without behavioral disturbance, psychotic  disturbance, mood disturbance, or anxiety, unspecified dementia type (CMD)  Weight loss Comments: labs have been ok, pt is very physically active.  Monitor weight. BMI 16. Recheck 6 months for CPE/AWV. Needs to have weight in office with  nurse visit in 2-3 months  -Continue current BP regimen  -Continue to reduce sodium in diet.  -Diet low in refined carbohydrates (bread/rice/pasta/potatoes) and sugars (sweets, sugary beverages) as well as unhealthy fats (fried foods, many of the processed/prepackaged snack foods) and regular exercise recommended. -Goal blood pressure ideally <130/80 - LDL cholesterol <70 - A1C <2.9   Patient verbalizes understanding and in agreement with the above plan. All questions answered.   Medication side effects discussed with patient. Advised patient to call clinic or return for visit if these symptoms occur.   Goals of care discussed with patient including med compliance and adequate follow up.  Return in about 6 months (around 08/18/2024) for cpe, fasting lab, AWV.    This document serves as a record of services personally performed by Brendan Wagner, FN.  It was created on their behalf by Brendan Wagner, CMA, a trained medical scribe, and Certified Medical Assistant (CMA). During the course of documenting the history, physical exam and medical decision making, I was functioning as a stage manager. The creation of this record is the providers dictation and/or activities during the visit.  Electronically signed by Brendan Wagner, CMA 02/13/2024 3:42 PM    This document was created using the aid of voice recognition Dragon dictation software.   Brendan Tarry Molt, FNP       [1] Past Medical History: Diagnosis Date   Diverticulitis    Hyperlipidemia    Hypertension    Tinnitus   [2] Past Surgical History: Procedure Laterality Date   APPENDECTOMY     Procedure: APPENDECTOMY   CATARACT EXTRACTION BILATERAL W/ ANTERIOR VITRECTOMY      HAND SURGERY Right 10/08/2022   RLF cyst exc/debride DIP   SHOULDER ARTHROSCOPY     Procedure: SHOULDER ARTHROSCOPY   VASECTOMY     Procedure: VASECTOMY  [3] Family History Problem Relation Name Age of Onset   Cancer Mother         breast   Non-Hodgkin's lymphoma Mother     Hyperlipidemia Father     Hypertension Father     Alzheimer's disease Father     Cancer Father         mets   Cancer Brother         type of blood cancer   Cancer Maternal Grandmother         unknown type   Cancer Maternal Grandfather         unknown type   Cancer Paternal Grandmother         unknown type   Cancer Paternal Grandfather         unknown type  [4] Social History Socioeconomic History   Marital status: Married  Tobacco Use   Smoking status: Never   Smokeless tobacco: Never  Substance and Sexual Activity   Alcohol use: Yes   Drug use: Never  Social Drivers of Health   Food Insecurity: Low Risk  (01/19/2023)   Food vital sign    Within the past 12 months, you worried that your food would run out before you got money to buy more: Never true    Within the past 12 months, the food you bought just didn't last and you didn't have money to get more: Never true  Transportation Needs: No Transportation Needs (01/19/2023)   Transportation    In the past 12 months, has lack of reliable transportation kept you from medical appointments, meetings, work or from getting things needed for daily living? : No  Living Situation: Low Risk  (01/19/2023)   Living Situation    What is your living situation today?: I have a steady place to live    Think about the place you live. Do you have problems with any of the following? Choose all that apply:: None/None on this list  [5] Social History Tobacco Use  Smoking Status Never  Smokeless Tobacco Never  [6] Current Outpatient Medications  Medication Sig Dispense Refill   aspirin 81 mg EC tablet Take 81 mg by mouth.      DOCOSAHEXAENOIC ACID ORAL Take 1 g by mouth.     donepeziL  (ARICEPT ) 10 mg tablet Take 10 mg by mouth daily.     escitalopram  (LEXAPRO ) 10 mg tablet Take 5 mg by mouth daily.     ibuprofen (MOTRIN) 800 mg tablet Take 800 mg by mouth every 6 (six) hours as needed. 60 tablet 0   meloxicam  (MOBIC ) 7.5 mg tablet Take 1 tablet by mouth.     memantine  (NAMENDA ) 10 mg tablet Take 10 mg by mouth 2 (two) times a day.     omega 3-dha-epa-fish oil (OMEGA 3) 1,000 mg capsule Take 1 g by mouth Once Daily.     amLODIPine  (NORVASC ) 5 mg tablet Take 1 tablet (5 mg total) by mouth daily. 100 tablet 1   lisinopriL -hydroCHLOROthiazide  (PRINZIDE ) 20-12.5 mg per tablet Take 1 tablet by mouth daily. 100 tablet 2   rosuvastatin (CRESTOR) 5 mg tablet Take 1 tablet (5 mg total) by mouth daily. 90 tablet 3   No current facility-administered medications for this visit.  "

## 2024-02-16 DIAGNOSIS — E785 Hyperlipidemia, unspecified: Secondary | ICD-10-CM | POA: Diagnosis not present

## 2024-02-16 DIAGNOSIS — I10 Essential (primary) hypertension: Secondary | ICD-10-CM | POA: Diagnosis not present

## 2024-02-16 DIAGNOSIS — F03A0 Unspecified dementia, mild, without behavioral disturbance, psychotic disturbance, mood disturbance, and anxiety: Secondary | ICD-10-CM | POA: Diagnosis not present

## 2024-02-16 DIAGNOSIS — R634 Abnormal weight loss: Secondary | ICD-10-CM | POA: Diagnosis not present
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
## 2024-07-07 ENCOUNTER — Ambulatory Visit: Admitting: Physician Assistant

## 2024-07-07 ENCOUNTER — Encounter: Payer: Self-pay | Admitting: Physician Assistant

## 2024-07-07 VITALS — BP 131/62 | HR 62 | Ht 70.0 in | Wt 159.4 lb

## 2024-07-07 DIAGNOSIS — G309 Alzheimer's disease, unspecified: Secondary | ICD-10-CM

## 2024-07-07 DIAGNOSIS — F0150 Vascular dementia without behavioral disturbance: Secondary | ICD-10-CM

## 2024-07-07 DIAGNOSIS — F0280 Dementia in other diseases classified elsewhere without behavioral disturbance: Secondary | ICD-10-CM | POA: Diagnosis not present

## 2024-07-07 MED ORDER — MEMANTINE HCL 10 MG PO TABS: 10.0000 mg | ORAL_TABLET | Freq: Two times a day (BID) | ORAL | 3 refills | Status: AC

## 2024-07-07 MED ORDER — ESCITALOPRAM OXALATE 10 MG PO TABS: ORAL_TABLET | ORAL | 11 refills | Status: DC

## 2024-07-07 MED ORDER — DONEPEZIL HCL 10 MG PO TABS: 10.0000 mg | ORAL_TABLET | Freq: Every day | ORAL | 3 refills | Status: AC

## 2024-07-07 NOTE — Patient Instructions (Addendum)
 It was a pleasure to see you today at our office.   Recommendations:    Follow up in  6 months Continue Memantine  10 mg twice daily Continue Donepezil  10 mg daily.   Lexapro  10 mg daily for mood  Recommend visiting the website :  Dementia Success Path to better understand some behaviors related to memory loss.      Whom to call: Memory  decline, memory medications: Call our office [phone]    [email or web address]   RECOMMENDATIONS FOR ALL PATIENTS WITH MEMORY PROBLEMS: 1. Continue to exercise (Recommend 30 minutes of walking everyday, or 3 hours every week) 2. Increase social interactions - continue going to [place] and enjoy social gatherings with friends and family 3. Eat healthy, avoid fried foods and eat more fruits and vegetables 4. Maintain adequate blood pressure, blood sugar, and blood cholesterol level. Reducing the risk of stroke and cardiovascular disease also helps promoting better memory. 5. Avoid stressful situations. Live a simple life and avoid aggravations. Organize your time and prepare for the next day in anticipation. 6. Sleep well, avoid any interruptions of sleep and avoid any distractions in the bedroom that may interfere with adequate sleep quality 7. Avoid sugar, avoid sweets as there is a strong link between excessive sugar intake, diabetes, and cognitive impairment We discussed the Mediterranean diet, which has been shown to help patients reduce the risk of progressive memory disorders and reduces cardiovascular risk. This includes eating fish, eat fruits and green leafy vegetables, nuts like almonds and hazelnuts, walnuts, and also use olive oil. Avoid fast foods and fried foods as much as possible. Avoid sweets and sugar as sugar use has been linked to worsening of memory function.  There is always a concern of gradual progression of memory problems. If this is the case, then we may need to adjust  level of care according to patient needs. Support, both to the patient and caregiver, should then be put into place.     DRIVING: Regarding driving, in patients with progressive memory problems, driving will be impaired. We advise to have someone else do the driving if trouble finding directions or if minor accidents are reported. Independent driving assessment is available to determine safety of driving.   If you are interested in the driving assessment, you can contact the following:  [employer] in [place] [phone]  [facility] [phone]  [facility] [phone]  [address] [phone] or [phone]   FALL PRECAUTIONS: Be cautious when walking. Scan the area for obstacles that may increase the risk of trips and falls. When getting up in the mornings, sit up at the edge of the bed for a few minutes before getting out of bed. Consider elevating the bed at the head end to avoid drop of blood pressure when getting up. Walk always in a well-lit room (use night lights in the walls). Avoid area rugs or power cords from appliances in the middle of the walkways. Use a walker or a cane if necessary and consider physical therapy for balance exercise. Get your eyesight checked regularly.  FINANCIAL OVERSIGHT: Supervision, especially oversight when making financial decisions or transactions is also recommended.  HOME SAFETY: Consider the safety of the kitchen when operating appliances like stoves, microwave oven, and blender. Consider having supervision and share cooking responsibilities until no longer able to participate in those. Accidents with firearms and other hazards in the house should be identified and addressed as well.   ABILITY TO BE LEFT ALONE: If  patient is unable to contact 911 operator, consider using LifeLine, or when the need is there, arrange for someone to stay with patients. Smoking is a fire hazard, consider  supervision or cessation. Risk of wandering should be assessed by caregiver and if detected at any point, supervision and safe proof recommendations should be instituted.  MEDICATION SUPERVISION: Inability to self-administer medication needs to be constantly addressed. Implement a mechanism to ensure safe administration of the medications.      Mediterranean Diet A Mediterranean diet refers to food and lifestyle choices that are based on the traditions of countries located on the [place]. This way of eating has been shown to help prevent certain conditions and improve outcomes for people who have chronic diseases, like kidney disease and heart disease. What are tips for following this plan? Lifestyle  Cook and eat meals together with your family, when possible. Drink enough fluid to keep your urine clear or pale yellow. Be physically active every day. This includes: Aerobic exercise like running or swimming. Leisure activities like gardening, walking, or housework. Get 7-8 hours of sleep each night. If recommended by your health care provider, drink red wine in moderation. This means 1 glass a day for nonpregnant women and 2 glasses a day for men. A glass of wine equals 5 oz (150 mL). Reading food labels  Check the serving size of packaged foods. For foods such as rice and pasta, the serving size refers to the amount of cooked product, not dry. Check the total fat in packaged foods. Avoid foods that have saturated fat or trans fats. Check the ingredients list for added sugars, such as corn syrup. Shopping  At the grocery store, buy most of your food from the areas near the walls of the store. This includes: Fresh fruits and vegetables (produce). Grains, beans, nuts, and seeds. Some of these may be available in unpackaged forms or large amounts (in bulk). Fresh seafood. Poultry and eggs. Low-fat dairy products. Buy whole ingredients instead of prepackaged foods. Buy fresh  fruits and vegetables in-season from local farmers markets. Buy frozen fruits and vegetables in resealable bags. If you do not have access to quality fresh seafood, buy precooked frozen shrimp or canned fish, such as tuna, salmon, or sardines. Buy small amounts of raw or cooked vegetables, salads, or olives from the deli or salad bar at your store. Stock your pantry so you always have certain foods on hand, such as olive oil, canned tuna, canned tomatoes, rice, pasta, and beans. Cooking  Cook foods with extra-virgin olive oil instead of using butter or other vegetable oils. Have meat as a side dish, and have vegetables or grains as your main dish. This means having meat in small portions or adding small amounts of meat to foods like pasta or stew. Use beans or vegetables instead of meat in common dishes like chili or lasagna. Experiment with different cooking methods. Try roasting or broiling vegetables instead of steaming or sauteing them. Add frozen vegetables to soups, stews, pasta, or rice. Add nuts or seeds for added healthy fat at each meal. You can add these to yogurt, salads, or vegetable dishes. Marinate fish or vegetables using olive oil, lemon juice, garlic, and fresh herbs. Meal planning  Plan to eat 1 vegetarian meal one day each week. Try to work up to 2 vegetarian meals, if possible. Eat seafood 2 or more times a week. Have healthy snacks readily available, such as: Vegetable sticks with hummus. Greek yogurt. Fruit and nut  trail mix. Eat balanced meals throughout the week. This includes: Fruit: 2-3 servings a day Vegetables: 4-5 servings a day Low-fat dairy: 2 servings a day Fish, poultry, or lean meat: 1 serving a day Beans and legumes: 2 or more servings a week Nuts and seeds: 1-2 servings a day Whole grains: 6-8 servings a day Extra-virgin olive oil: 3-4 servings a day Limit red meat and sweets to only a few servings a month What are my food choices? Mediterranean  diet Recommended Grains: Whole-grain pasta. Brown rice. Bulgar wheat. Polenta. Couscous. Whole-wheat bread. [place] Madeira. Vegetables: Artichokes. Beets. Broccoli. Cabbage. Carrots. Eggplant. Green beans. Chard. Kale. Spinach. Onions. Leeks. Peas. Squash. Tomatoes. Peppers. Radishes. Fruits: Apples. Apricots. Avocado. Berries. Bananas. Cherries. Dates. Figs. Grapes. Lemons. Melon. Oranges. Peaches. Plums. Pomegranate. Meats and other protein foods: Beans. Almonds. Sunflower seeds. Pine nuts. Peanuts. Cod. Salmon. Scallops. Shrimp. Tuna. Tilapia. Clams. Oysters. Eggs. Dairy: Low-fat milk. Cheese. Greek yogurt. Beverages: Water. Red wine. Herbal tea. Fats and oils: Extra virgin olive oil. Avocado oil. Grape seed oil. Sweets and desserts: Greek yogurt with honey. Baked apples. Poached pears. Trail mix. Seasoning and other foods: Basil. Cilantro. Coriander. Cumin. Mint. Parsley. Sage. Rosemary. Tarragon. Garlic. Oregano. Thyme. Pepper. Balsalmic vinegar. Tahini. Hummus. Tomato sauce. Olives. Mushrooms. Limit these Grains: Prepackaged pasta or rice dishes. Prepackaged cereal with added sugar. Vegetables: Deep fried potatoes (french fries). Fruits: Fruit canned in syrup. Meats and other protein foods: Beef. Pork. Lamb. Poultry with skin. Hot dogs. [place]. Dairy: Ice cream. Sour cream. Whole milk. Beverages: Juice. Sugar-sweetened soft drinks. Beer. Liquor and spirits. Fats and oils: Butter. Canola oil. Vegetable oil. Beef fat (tallow). Lard. Sweets and desserts: Cookies. Cakes. Pies. Candy. Seasoning and other foods: Mayonnaise. Premade sauces and marinades. The items listed may not be a complete list. Talk with your dietitian about what dietary choices are right for you. Summary The Mediterranean diet includes both food and lifestyle choices. Eat a variety of fresh fruits and vegetables, beans, nuts, seeds, and whole grains. Limit the amount of red meat and sweets that you eat. Talk with your  health care provider about whether it is safe for you to drink red wine in moderation. This means 1 glass a day for nonpregnant women and 2 glasses a day for men. A glass of wine equals 5 oz (150 mL). This information is not intended to replace advice given to you by your health care provider. Make sure you discuss any questions you have with your health care provider. Document Released: 02/08/2016 Document Revised: 03/12/2016 Document Reviewed: 02/08/2016 Elsevier Interactive Patient Education  2017 [employer].

## 2024-07-29 ENCOUNTER — Other Ambulatory Visit: Payer: Self-pay | Admitting: Physician Assistant

## 2025-01-05 ENCOUNTER — Ambulatory Visit: Payer: Self-pay | Admitting: Physician Assistant
